# Patient Record
Sex: Male | Born: 1955 | Race: White | Hispanic: No | Marital: Married | Smoking: Never smoker
Health system: Southern US, Community
[De-identification: ages and names within clinical notes are randomized; demographics above are authoritative.]

## PROBLEM LIST (undated history)

## (undated) DIAGNOSIS — Z872 Personal history of diseases of the skin and subcutaneous tissue: Secondary | ICD-10-CM

## (undated) DIAGNOSIS — G809 Cerebral palsy, unspecified: Secondary | ICD-10-CM

## (undated) DIAGNOSIS — I10 Essential (primary) hypertension: Secondary | ICD-10-CM

## (undated) DIAGNOSIS — I2510 Atherosclerotic heart disease of native coronary artery without angina pectoris: Secondary | ICD-10-CM

## (undated) DIAGNOSIS — E785 Hyperlipidemia, unspecified: Secondary | ICD-10-CM

## (undated) DIAGNOSIS — K219 Gastro-esophageal reflux disease without esophagitis: Secondary | ICD-10-CM

## (undated) HISTORY — PX: OTHER SURGICAL HISTORY: SHX169

## (undated) HISTORY — PX: FOOT SURGERY: SHX648

## (undated) HISTORY — PX: HAND SURGERY: SHX662

## (undated) HISTORY — PX: EYE SURGERY: SHX253

## (undated) HISTORY — PX: HIP PINNING: SHX1757

## (undated) HISTORY — PX: CARDIAC SURGERY: SHX584

## (undated) HISTORY — PX: HERNIA REPAIR: SHX51

---

## 2013-04-28 DIAGNOSIS — I10 Essential (primary) hypertension: Secondary | ICD-10-CM | POA: Insufficient documentation

## 2015-08-10 DIAGNOSIS — I10 Essential (primary) hypertension: Secondary | ICD-10-CM | POA: Insufficient documentation

## 2015-08-10 DIAGNOSIS — E785 Hyperlipidemia, unspecified: Secondary | ICD-10-CM | POA: Insufficient documentation

## 2015-08-10 DIAGNOSIS — M719 Bursopathy, unspecified: Secondary | ICD-10-CM | POA: Insufficient documentation

## 2015-08-10 DIAGNOSIS — M21969 Unspecified acquired deformity of unspecified lower leg: Secondary | ICD-10-CM | POA: Insufficient documentation

## 2015-08-10 DIAGNOSIS — M7750 Other enthesopathy of unspecified foot: Secondary | ICD-10-CM | POA: Insufficient documentation

## 2015-08-10 DIAGNOSIS — M7580 Other shoulder lesions, unspecified shoulder: Secondary | ICD-10-CM

## 2015-08-10 DIAGNOSIS — M778 Other enthesopathies, not elsewhere classified: Secondary | ICD-10-CM | POA: Insufficient documentation

## 2015-08-10 DIAGNOSIS — M545 Low back pain, unspecified: Secondary | ICD-10-CM | POA: Insufficient documentation

## 2015-08-19 DIAGNOSIS — K219 Gastro-esophageal reflux disease without esophagitis: Secondary | ICD-10-CM | POA: Insufficient documentation

## 2015-08-30 DIAGNOSIS — G809 Cerebral palsy, unspecified: Secondary | ICD-10-CM | POA: Insufficient documentation

## 2015-12-09 DIAGNOSIS — M21962 Unspecified acquired deformity of left lower leg: Secondary | ICD-10-CM

## 2015-12-09 DIAGNOSIS — M21961 Unspecified acquired deformity of right lower leg: Secondary | ICD-10-CM | POA: Insufficient documentation

## 2015-12-09 DIAGNOSIS — G8929 Other chronic pain: Secondary | ICD-10-CM | POA: Insufficient documentation

## 2015-12-09 DIAGNOSIS — M79672 Pain in left foot: Secondary | ICD-10-CM

## 2015-12-09 DIAGNOSIS — M79671 Pain in right foot: Secondary | ICD-10-CM

## 2016-11-27 ENCOUNTER — Inpatient Hospital Stay (HOSPITAL_COMMUNITY): Payer: Medicare HMO

## 2016-11-27 ENCOUNTER — Inpatient Hospital Stay (HOSPITAL_COMMUNITY)
Admission: EM | Admit: 2016-11-27 | Discharge: 2016-12-03 | DRG: 236 | Disposition: A | Discharge status: Skilled Nursing Facility | Payer: Medicare HMO | Source: Other Acute Inpatient Hospital | Attending: Cardiothoracic Surgery | Admitting: Cardiothoracic Surgery

## 2016-11-27 ENCOUNTER — Encounter (HOSPITAL_COMMUNITY): Payer: Self-pay | Admitting: Physician Assistant

## 2016-11-27 ENCOUNTER — Other Ambulatory Visit: Payer: Self-pay | Admitting: *Deleted

## 2016-11-27 DIAGNOSIS — G8929 Other chronic pain: Secondary | ICD-10-CM | POA: Diagnosis present

## 2016-11-27 DIAGNOSIS — Z951 Presence of aortocoronary bypass graft: Secondary | ICD-10-CM | POA: Diagnosis not present

## 2016-11-27 DIAGNOSIS — Z01810 Encounter for preprocedural cardiovascular examination: Secondary | ICD-10-CM

## 2016-11-27 DIAGNOSIS — E8770 Fluid overload, unspecified: Secondary | ICD-10-CM | POA: Diagnosis not present

## 2016-11-27 DIAGNOSIS — D72829 Elevated white blood cell count, unspecified: Secondary | ICD-10-CM | POA: Diagnosis present

## 2016-11-27 DIAGNOSIS — D62 Acute posthemorrhagic anemia: Secondary | ICD-10-CM | POA: Diagnosis not present

## 2016-11-27 DIAGNOSIS — Z7982 Long term (current) use of aspirin: Secondary | ICD-10-CM

## 2016-11-27 DIAGNOSIS — I214 Non-ST elevation (NSTEMI) myocardial infarction: Secondary | ICD-10-CM | POA: Diagnosis present

## 2016-11-27 DIAGNOSIS — K219 Gastro-esophageal reflux disease without esophagitis: Secondary | ICD-10-CM | POA: Diagnosis present

## 2016-11-27 DIAGNOSIS — I25810 Atherosclerosis of coronary artery bypass graft(s) without angina pectoris: Secondary | ICD-10-CM | POA: Diagnosis not present

## 2016-11-27 DIAGNOSIS — Z79899 Other long term (current) drug therapy: Secondary | ICD-10-CM

## 2016-11-27 DIAGNOSIS — G801 Spastic diplegic cerebral palsy: Secondary | ICD-10-CM | POA: Diagnosis present

## 2016-11-27 DIAGNOSIS — M545 Low back pain: Secondary | ICD-10-CM | POA: Diagnosis present

## 2016-11-27 DIAGNOSIS — I493 Ventricular premature depolarization: Secondary | ICD-10-CM | POA: Diagnosis not present

## 2016-11-27 DIAGNOSIS — I2510 Atherosclerotic heart disease of native coronary artery without angina pectoris: Secondary | ICD-10-CM

## 2016-11-27 DIAGNOSIS — I25110 Atherosclerotic heart disease of native coronary artery with unstable angina pectoris: Secondary | ICD-10-CM

## 2016-11-27 DIAGNOSIS — Z9689 Presence of other specified functional implants: Secondary | ICD-10-CM

## 2016-11-27 DIAGNOSIS — R079 Chest pain, unspecified: Secondary | ICD-10-CM | POA: Diagnosis present

## 2016-11-27 DIAGNOSIS — L0390 Cellulitis, unspecified: Secondary | ICD-10-CM | POA: Insufficient documentation

## 2016-11-27 DIAGNOSIS — E785 Hyperlipidemia, unspecified: Secondary | ICD-10-CM | POA: Diagnosis present

## 2016-11-27 DIAGNOSIS — I10 Essential (primary) hypertension: Secondary | ICD-10-CM | POA: Diagnosis present

## 2016-11-27 DIAGNOSIS — R269 Unspecified abnormalities of gait and mobility: Secondary | ICD-10-CM | POA: Insufficient documentation

## 2016-11-27 HISTORY — DX: Gastro-esophageal reflux disease without esophagitis: K21.9

## 2016-11-27 HISTORY — DX: Essential (primary) hypertension: I10

## 2016-11-27 HISTORY — DX: Cerebral palsy, unspecified: G80.9

## 2016-11-27 HISTORY — DX: Personal history of diseases of the skin and subcutaneous tissue: Z87.2

## 2016-11-27 HISTORY — DX: Hyperlipidemia, unspecified: E78.5

## 2016-11-27 HISTORY — DX: Atherosclerotic heart disease of native coronary artery without angina pectoris: I25.10

## 2016-11-27 LAB — VAS US DOPPLER PRE CABG
LCCADSYS: 108 cm/s
LCCAPDIAS: 25 cm/s
LEFT ECA DIAS: -15 cm/s
LEFT VERTEBRAL DIAS: -17 cm/s
LICADSYS: -49 cm/s
LICAPDIAS: 22 cm/s
Left CCA dist dias: 32 cm/s
Left CCA prox sys: 113 cm/s
Left ICA dist dias: -20 cm/s
Left ICA prox sys: 76 cm/s
RIGHT ECA DIAS: -11 cm/s
RIGHT VERTEBRAL DIAS: -15 cm/s
Right CCA prox dias: 21 cm/s
Right CCA prox sys: 74 cm/s
Right cca dist sys: -71 cm/s

## 2016-11-27 LAB — PULMONARY FUNCTION TEST
FEF 25-75 POST: 2.83 L/s
FEF 25-75 Pre: 2.38 L/sec
FEF2575-%Change-Post: 18 %
FEF2575-%PRED-POST: 113 %
FEF2575-%PRED-PRE: 95 %
FEV1-%CHANGE-POST: 2 %
FEV1-%PRED-POST: 109 %
FEV1-%Pred-Pre: 106 %
FEV1-POST: 3.33 L
FEV1-Pre: 3.24 L
FEV1FVC-%Change-Post: 2 %
FEV1FVC-%PRED-PRE: 96 %
FEV6-%Change-Post: 0 %
FEV6-%PRED-POST: 114 %
FEV6-%Pred-Pre: 114 %
FEV6-Post: 4.38 L
FEV6-Pre: 4.4 L
FEV6FVC-%CHANGE-POST: 0 %
FEV6FVC-%Pred-Post: 103 %
FEV6FVC-%Pred-Pre: 103 %
FVC-%Change-Post: 0 %
FVC-%PRED-POST: 110 %
FVC-%Pred-Pre: 110 %
FVC-Post: 4.45 L
FVC-Pre: 4.46 L
PRE FEV6/FVC RATIO: 99 %
Post FEV1/FVC ratio: 75 %
Post FEV6/FVC ratio: 98 %
Pre FEV1/FVC ratio: 73 %

## 2016-11-27 LAB — COMPREHENSIVE METABOLIC PANEL
ALT: 21 U/L (ref 17–63)
AST: 40 U/L (ref 15–41)
Albumin: 3.6 g/dL (ref 3.5–5.0)
Alkaline Phosphatase: 75 U/L (ref 38–126)
Anion gap: 9 (ref 5–15)
BUN: 15 mg/dL (ref 6–20)
CHLORIDE: 106 mmol/L (ref 101–111)
CO2: 21 mmol/L — ABNORMAL LOW (ref 22–32)
Calcium: 8.9 mg/dL (ref 8.9–10.3)
Creatinine, Ser: 0.87 mg/dL (ref 0.61–1.24)
GFR calc Af Amer: >60 mL/min (ref >60)
Glucose, Bld: 101 mg/dL — ABNORMAL HIGH (ref 65–99)
Potassium: 5 mmol/L (ref 3.5–5.1)
Sodium: 136 mmol/L (ref 135–145)
Total Bilirubin: 1.6 mg/dL — ABNORMAL HIGH (ref 0.3–1.2)
Total Protein: 6.5 g/dL (ref 6.5–8.1)

## 2016-11-27 LAB — TYPE AND SCREEN
ABO/RH(D): O POS
Antibody Screen: NEGATIVE

## 2016-11-27 LAB — CBC
HEMATOCRIT: 48.1 % (ref 39.0–52.0)
Hemoglobin: 16.6 g/dL (ref 13.0–17.0)
MCH: 31.3 pg (ref 26.0–34.0)
MCHC: 34.5 g/dL (ref 30.0–36.0)
MCV: 90.6 fL (ref 78.0–100.0)
Platelets: 270 10*3/uL (ref 150–400)
RBC: 5.31 MIL/uL (ref 4.22–5.81)
RDW: 13.5 % (ref 11.5–15.5)
WBC: 14.9 10*3/uL — ABNORMAL HIGH (ref 4.0–10.5)

## 2016-11-27 LAB — TROPONIN I: TROPONIN I: 0.11 ng/mL — AB (ref <0.03)

## 2016-11-27 LAB — ABO/RH: ABO/RH(D): O POS

## 2016-11-27 MED ORDER — CHLORHEXIDINE GLUCONATE CLOTH 2 % EX PADS
6.0000 | MEDICATED_PAD | Freq: Once | CUTANEOUS | Status: AC
  Administered 2016-11-28: 6 via TOPICAL

## 2016-11-27 MED ORDER — NITROGLYCERIN 0.4 MG SL SUBL: 0.4000 mg | SUBLINGUAL_TABLET | SUBLINGUAL | Status: DC | PRN

## 2016-11-27 MED ORDER — DOPAMINE-DEXTROSE 3.2-5 MG/ML-% IV SOLN
0.0000 ug/kg/min | INTRAVENOUS | Status: DC
  Filled 2016-11-27: qty 250

## 2016-11-27 MED ORDER — ASPIRIN 81 MG PO CHEW: 324.0000 mg | CHEWABLE_TABLET | ORAL | Status: DC

## 2016-11-27 MED ORDER — METOPROLOL TARTRATE 12.5 MG HALF TABLET
12.5000 mg | ORAL_TABLET | Freq: Once | ORAL | Status: DC
  Filled 2016-11-27: qty 1

## 2016-11-27 MED ORDER — ONDANSETRON HCL 4 MG/2ML IJ SOLN: 4.0000 mg | Freq: Four times a day (QID) | INTRAMUSCULAR | Status: DC | PRN

## 2016-11-27 MED ORDER — VANCOMYCIN HCL 10 G IV SOLR
1250.0000 mg | INTRAVENOUS | Status: DC
  Administered 2016-11-28: 1250 mg via INTRAVENOUS
  Filled 2016-11-27: qty 1250

## 2016-11-27 MED ORDER — TEMAZEPAM 15 MG PO CAPS: 15.0000 mg | ORAL_CAPSULE | Freq: Once | ORAL | Status: DC | PRN

## 2016-11-27 MED ORDER — ASPIRIN EC 325 MG PO TBEC: 325.0000 mg | DELAYED_RELEASE_TABLET | Freq: Every day | ORAL | Status: DC

## 2016-11-27 MED ORDER — TRANEXAMIC ACID (OHS) PUMP PRIME SOLUTION
2.0000 mg/kg | INTRAVENOUS | Status: DC
  Filled 2016-11-27: qty 1.52

## 2016-11-27 MED ORDER — PANTOPRAZOLE SODIUM 40 MG PO TBEC
40.0000 mg | DELAYED_RELEASE_TABLET | Freq: Two times a day (BID) | ORAL | Status: DC
  Administered 2016-11-27: 40 mg via ORAL
  Filled 2016-11-27: qty 1

## 2016-11-27 MED ORDER — METOPROLOL TARTRATE 25 MG PO TABS
25.0000 mg | ORAL_TABLET | Freq: Four times a day (QID) | ORAL | Status: DC
  Administered 2016-11-27 (×2): 25 mg via ORAL
  Filled 2016-11-27 (×2): qty 1

## 2016-11-27 MED ORDER — CHLORHEXIDINE GLUCONATE 0.12 % MT SOLN
15.0000 mL | Freq: Once | OROMUCOSAL | Status: AC
  Administered 2016-11-28: 15 mL via OROMUCOSAL
  Filled 2016-11-27: qty 15

## 2016-11-27 MED ORDER — SODIUM CHLORIDE 0.9 % IV SOLN
INTRAVENOUS | Status: DC
  Filled 2016-11-27: qty 30

## 2016-11-27 MED ORDER — BISACODYL 5 MG PO TBEC
5.0000 mg | DELAYED_RELEASE_TABLET | Freq: Once | ORAL | Status: AC
  Administered 2016-11-27: 5 mg via ORAL
  Filled 2016-11-27: qty 1

## 2016-11-27 MED ORDER — TRANEXAMIC ACID (OHS) BOLUS VIA INFUSION
15.0000 mg/kg | INTRAVENOUS | Status: DC
Start: 2016-11-28 — End: 2016-11-28
  Administered 2016-11-28: 1143 mg via INTRAVENOUS
  Filled 2016-11-27: qty 1143

## 2016-11-27 MED ORDER — ATORVASTATIN CALCIUM 80 MG PO TABS
80.0000 mg | ORAL_TABLET | Freq: Every day | ORAL | Status: DC
  Administered 2016-11-27 – 2016-12-02 (×4): 80 mg via ORAL
  Filled 2016-11-27 (×5): qty 1

## 2016-11-27 MED ORDER — ASPIRIN EC 81 MG PO TBEC
81.0000 mg | DELAYED_RELEASE_TABLET | Freq: Every day | ORAL | Status: DC
  Administered 2016-11-28: 81 mg via ORAL
  Filled 2016-11-27: qty 1

## 2016-11-27 MED ORDER — ENOXAPARIN SODIUM 40 MG/0.4ML ~~LOC~~ SOLN: 40.0000 mg | SUBCUTANEOUS | Status: DC

## 2016-11-27 MED ORDER — SODIUM CHLORIDE 0.9 % IV SOLN: INTRAVENOUS | Status: DC

## 2016-11-27 MED ORDER — SODIUM CHLORIDE 0.9 % IV SOLN
INTRAVENOUS | Status: DC
  Administered 2016-11-28: 1 [IU]/h via INTRAVENOUS
  Filled 2016-11-27: qty 1

## 2016-11-27 MED ORDER — ASPIRIN 300 MG RE SUPP: 300.0000 mg | RECTAL | Status: DC

## 2016-11-27 MED ORDER — PAPAVERINE HCL 30 MG/ML IJ SOLN
INTRAMUSCULAR | Status: DC
  Filled 2016-11-27: qty 2.5

## 2016-11-27 MED ORDER — SODIUM CHLORIDE 0.9 % IV SOLN
1.5000 mg/kg/h | INTRAVENOUS | Status: DC
  Filled 2016-11-27: qty 25

## 2016-11-27 MED ORDER — SODIUM CHLORIDE 0.9 % IV SOLN
INTRAVENOUS | Status: DC | PRN
  Administered 2016-11-27: 1000 mL via INTRAVENOUS

## 2016-11-27 MED ORDER — TERBINAFINE HCL 250 MG PO TABS
250.0000 mg | ORAL_TABLET | Freq: Every day | ORAL | Status: DC
Start: 2016-11-28 — End: 2016-12-03
  Administered 2016-11-30 – 2016-12-03 (×4): 250 mg via ORAL
  Filled 2016-11-27 (×6): qty 1

## 2016-11-27 MED ORDER — CEFUROXIME SODIUM 750 MG IJ SOLR
750.0000 mg | INTRAMUSCULAR | Status: DC
  Filled 2016-11-27: qty 750

## 2016-11-27 MED ORDER — ASPIRIN EC 81 MG PO TBEC: 81.0000 mg | DELAYED_RELEASE_TABLET | Freq: Every day | ORAL | Status: DC

## 2016-11-27 MED ORDER — DEXMEDETOMIDINE HCL IN NACL 400 MCG/100ML IV SOLN
0.1000 ug/kg/h | INTRAVENOUS | Status: DC
  Administered 2016-11-28: .3 ug/kg/h via INTRAVENOUS
  Filled 2016-11-27: qty 100

## 2016-11-27 MED ORDER — DEXTROSE 5 % IV SOLN
1.5000 g | INTRAVENOUS | Status: DC
  Administered 2016-11-28: 1.5 g via INTRAVENOUS
  Administered 2016-11-28: .75 g via INTRAVENOUS
  Filled 2016-11-27: qty 1.5

## 2016-11-27 MED ORDER — NITROGLYCERIN IN D5W 200-5 MCG/ML-% IV SOLN
2.0000 ug/min | INTRAVENOUS | Status: DC
  Administered 2016-11-28 (×2): 5 ug/min via INTRAVENOUS
  Filled 2016-11-27: qty 250

## 2016-11-27 MED ORDER — POTASSIUM CHLORIDE 2 MEQ/ML IV SOLN
80.0000 meq | INTRAVENOUS | Status: DC
  Filled 2016-11-27: qty 40

## 2016-11-27 MED ORDER — PHENYLEPHRINE HCL 10 MG/ML IJ SOLN
30.0000 ug/min | INTRAMUSCULAR | Status: DC
Start: 2016-11-28 — End: 2016-11-28
  Filled 2016-11-27: qty 2

## 2016-11-27 MED ORDER — ALBUTEROL SULFATE (2.5 MG/3ML) 0.083% IN NEBU
2.5000 mg | INHALATION_SOLUTION | Freq: Once | RESPIRATORY_TRACT | Status: AC
  Administered 2016-11-27: 2.5 mg via RESPIRATORY_TRACT

## 2016-11-27 MED ORDER — MAGNESIUM SULFATE 50 % IJ SOLN
40.0000 meq | INTRAMUSCULAR | Status: DC
  Filled 2016-11-27: qty 10

## 2016-11-27 MED ORDER — EPINEPHRINE PF 1 MG/ML IJ SOLN
0.0000 ug/min | INTRAVENOUS | Status: DC
  Filled 2016-11-27: qty 4

## 2016-11-27 MED ORDER — HEPARIN (PORCINE) IN NACL 100-0.45 UNIT/ML-% IJ SOLN
1100.0000 [IU]/h | INTRAMUSCULAR | Status: DC
  Administered 2016-11-27: 900 [IU]/h via INTRAVENOUS
  Filled 2016-11-27: qty 250

## 2016-11-27 NOTE — Progress Notes (Signed)
[id number] Gave pt OHS booklet and care guide. Wrote down how to view pre op video. Discussed sternal precautions and demonstrated how to get up without use of arms. Pt stated he uses one arm to help get him get up. Would recommend PT eval after surgery to help with ambulation and to make discharge recommendations as pt lives alone and family out of state. Will need case manager to see also to discuss options. Resp therapist in to perform test and will go over IS. We will follow up after surgery. Luetta NuttingCharlene Leotha Voeltz RN BSN 11/27/2016 3:22 PM

## 2016-11-27 NOTE — Anesthesia Preprocedure Evaluation (Addendum)
Anesthesia Evaluation  Patient identified by MRN, date of birth, ID band Patient awake    Reviewed: Allergy & Precautions, H&P , NPO status , Patient's Chart, lab work & pertinent test results  History of Anesthesia Complications (+) POST - OP SPINAL HEADACHE  Airway Mallampati: II  TM Distance: >3 FB Neck ROM: Full    Dental no notable dental hx. (+) Teeth Intact, Dental Advisory Given   Pulmonary neg pulmonary ROS,    Pulmonary exam normal breath sounds clear to auscultation       Cardiovascular Exercise Tolerance: Good hypertension, Pt. on medications and Pt. on home beta blockers + CAD   Rhythm:Regular Rate:Normal     Neuro/Psych negative neurological ROS  negative psych ROS   GI/Hepatic Neg liver ROS, GERD  Medicated and Controlled,  Endo/Other  negative endocrine ROS  Renal/GU negative Renal ROS  negative genitourinary   Musculoskeletal   Abdominal   Peds  Hematology negative hematology ROS (+)   Anesthesia Other Findings   Reproductive/Obstetrics negative OB ROS                           Anesthesia Physical Anesthesia Plan  ASA: IV  Anesthesia Plan: General   Post-op Pain Management:    Induction: Intravenous  PONV Risk Score and Plan: Treatment may vary due to age or medical condition  Airway Management Planned: Oral ETT  Additional Equipment: Arterial line, CVP, PA Cath, TEE and Ultrasound Guidance Line Placement  Intra-op Plan:   Post-operative Plan: Post-operative intubation/ventilation  Informed Consent: I have reviewed the patients History and Physical, chart, labs and discussed the procedure including the risks, benefits and alternatives for the proposed anesthesia with the patient or authorized representative who has indicated his/her understanding and acceptance.   Dental advisory given  Plan Discussed with: CRNA  Anesthesia Plan Comments:         Anesthesia Quick Evaluation

## 2016-11-27 NOTE — Progress Notes (Signed)
Right Lower Extremity Vein Map    Right Great Saphenous Vein   Segment Diameter Comment  1. Origin 4.578mm   2. High Thigh 3.172mm   3. Mid Thigh 2.665mm branch  4. Low Thigh 2.535mm   5. At Knee 2.11mm   6. High Calf 2.831mm   7. Low Calf 2.520mm   8. Ankle 2.431mm    mm    Right Small Saphenous Vein  Segment Diameter Comment  1. Origin 1.312mm   2. High Calf 1.704mm   3. Low Calf mm Not seen  4. Ankle mm              Left Great Saphenous Vein   Segment Diameter Comment  1. Origin 4.331mm   2. High Thigh 3.415mm   3. Mid Thigh 1.555mm br  4. Low Thigh 2.531mm   5. At Knee 2.931mm   6. High Calf 2.680mm   7. Low Calf 1.78mm   8. Ankle 2.520mm    mm    mm    mm     Left Small Saphenous Vein  Segment Diameter Comment  1. Origin 1.326mm   2. High Calf 1.278mm   3. Low Calf 1.346mm   4. Ankle 1.733mm    mm    mm    mm
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
## 2016-11-27 NOTE — Progress Notes (Addendum)
Assumed care when patient arrived to floor @ 12:38; patient alert & oriented; patient ambulates in room with no signs of distress; patient denies CP ; notified PA about increase Troponin level @1500   Tessa, PA returned phone call about Troponin level; see chart for orders

## 2016-11-27 NOTE — Progress Notes (Signed)
ANTICOAGULATION CONSULT NOTE - Initial Consult  Pharmacy Consult for heparin Indication: chest pain/ACS  Allergies  Allergen Reactions  . No Known Allergies    Patient Measurements: Weight: 168 lb (76.2 kg) Heparin Dosing Weight: 76 Kg  Vital Signs: Temp: 98.7 F (37.1 C) (06/20 1211) Temp Source: Oral (06/20 1211) BP: 162/90 (06/20 1213) Pulse Rate: 63 (06/20 1211)  Labs:  Recent Labs  11/27/16 1253 11/27/16 1529  HGB  --  16.6  HCT  --  48.1  PLT  --  270  CREATININE 0.87  --   TROPONINI 0.11*  --    CrCl cannot be calculated (Unknown ideal weight.).  Medical History: Past Medical History:  Diagnosis Date  . Coronary artery disease   . CP (cerebral palsy) (HCC)   . GERD (gastroesophageal reflux disease)   . History of cellulitis    bilateral lower extemites   . Hyperlipidemia   . Hypertension    Medications:  Prescriptions Prior to Admission  Medication Sig Dispense Refill Last Dose  . aspirin (GOODSENSE ASPIRIN) 325 MG tablet Take 325 mg by mouth daily.   11/27/2016 at Unknown  . atorvastatin (LIPITOR) 80 MG tablet Take 80 mg by mouth daily at 6 PM.    11/26/2016 at Unknown  . losartan (COZAAR) 25 MG tablet Take 25 mg by mouth daily.   11/27/2016 at Unknown time  . metoprolol tartrate (LOPRESSOR) 25 MG tablet Take 25 mg by mouth every 6 (six) hours.   11/27/2016 at 0623  . pantoprazole (PROTONIX) 40 MG tablet Take 40 mg by mouth 2 (two) times daily.   11/27/2016 at Unknown time   Assessment: 61 year old male transferred [facility]G 6/21. No anticoagulation prior to admission. Baseline CBC WNL.   Goal of Therapy:  Heparin level 0.3-0.7 units/ml Monitor platelets by anticoagulation protocol: Yes   Plan:  Give 4000 units bolus x 1 Start heparin infusion at 900 units/hr Check anti-Xa level in 6 hours and daily while on heparin Continue to monitor H&H and platelets  Karyme Mcconathy L Yesica Kemler 11/27/2016,4:45 PM

## 2016-11-27 NOTE — Progress Notes (Signed)
Pre-op Cardiac Surgery  Carotid Findings:  Bilateral 1-39% stenosis. Vertebral arteries - antegrade flow  Upper Extremity Right Left  Brachial Pressures 167 170  Radial Waveforms biphasic triphasic  Ulnar Waveforms Triphasic triphasic  Palmar Arch (Allen's Test) WNL WNL   Findings:      Lower  Extremity Right Left  Dorsalis Pedis 192 - biphasic 0 absent  Posterior Tibial 189 - triphasic 130 - biphasic  Ankle/Brachial Indices 1.1 0.76    Findings:  Right ABI - normal at rest. Left ABI - moderate arterial disease at rest.  Bryce Ramsey, BS, RDMS, RVT

## 2016-11-28 ENCOUNTER — Inpatient Hospital Stay (HOSPITAL_COMMUNITY): Payer: Medicare HMO

## 2016-11-28 ENCOUNTER — Encounter (HOSPITAL_COMMUNITY): Payer: Self-pay | Admitting: Anesthesiology

## 2016-11-28 ENCOUNTER — Inpatient Hospital Stay (HOSPITAL_COMMUNITY): Payer: Medicare HMO | Admitting: Anesthesiology

## 2016-11-28 ENCOUNTER — Encounter (HOSPITAL_COMMUNITY)
Admission: EM | Disposition: A | Discharge status: Skilled Nursing Facility | Payer: Self-pay | Source: Other Acute Inpatient Hospital | Attending: Cardiothoracic Surgery

## 2016-11-28 DIAGNOSIS — I25110 Atherosclerotic heart disease of native coronary artery with unstable angina pectoris: Secondary | ICD-10-CM | POA: Diagnosis not present

## 2016-11-28 HISTORY — PX: CORONARY ARTERY BYPASS GRAFT: SHX141

## 2016-11-28 HISTORY — PX: TEE WITHOUT CARDIOVERSION: SHX5443

## 2016-11-28 LAB — POCT I-STAT 3, ART BLOOD GAS (G3+)
ACID-BASE DEFICIT: 3 mmol/L — AB (ref 0.0–2.0)
Acid-Base Excess: 2 mmol/L (ref 0.0–2.0)
Acid-base deficit: 4 mmol/L — ABNORMAL HIGH (ref 0.0–2.0)
Acid-base deficit: 6 mmol/L — ABNORMAL HIGH (ref 0.0–2.0)
BICARBONATE: 26.5 mmol/L (ref 20.0–28.0)
BICARBONATE: 22.4 mmol/L (ref 20.0–28.0)
Bicarbonate: 21.3 mmol/L (ref 20.0–28.0)
Bicarbonate: 20.4 mmol/L (ref 20.0–28.0)
O2 SAT: 97 %
O2 SAT: 91 %
O2 Saturation: 100 %
O2 Saturation: 94 %
PCO2 ART: 40 mmHg (ref 32.0–48.0)
PCO2 ART: 41.1 mmHg (ref 32.0–48.0)
PCO2 ART: 42.8 mmHg (ref 32.0–48.0)
PH ART: 7.437 (ref 7.350–7.450)
PO2 ART: 301 mmHg — AB (ref 83.0–108.0)
PO2 ART: 91 mmHg (ref 83.0–108.0)
PO2 ART: 73 mmHg — AB (ref 83.0–108.0)
Patient temperature: 36.1
Patient temperature: 37.2
Patient temperature: 38.1
TCO2: 28 mmol/L (ref 0–100)
TCO2: 24 mmol/L (ref 0–100)
TCO2: 22 mmol/L (ref 0–100)
TCO2: 22 mmol/L (ref 0–100)
pCO2 arterial: 39.2 mmHg (ref 32.0–48.0)
pH, Arterial: 7.353 (ref 7.350–7.450)
pH, Arterial: 7.323 — ABNORMAL LOW (ref 7.350–7.450)
pH, Arterial: 7.291 — ABNORMAL LOW (ref 7.350–7.450)
pO2, Arterial: 79 mmHg — ABNORMAL LOW (ref 83.0–108.0)

## 2016-11-28 LAB — POCT I-STAT, CHEM 8
BUN: 12 mg/dL (ref 6–20)
BUN: 11 mg/dL (ref 6–20)
BUN: 10 mg/dL (ref 6–20)
BUN: 10 mg/dL (ref 6–20)
BUN: 9 mg/dL (ref 6–20)
BUN: 8 mg/dL (ref 6–20)
CALCIUM ION: 1.2 mmol/L (ref 1.15–1.40)
CALCIUM ION: 1.16 mmol/L (ref 1.15–1.40)
CHLORIDE: 105 mmol/L (ref 101–111)
CHLORIDE: 105 mmol/L (ref 101–111)
CHLORIDE: 108 mmol/L (ref 101–111)
CREATININE: 0.5 mg/dL — AB (ref 0.61–1.24)
CREATININE: 0.6 mg/dL — AB (ref 0.61–1.24)
CREATININE: 0.7 mg/dL (ref 0.61–1.24)
Calcium, Ion: 1.23 mmol/L (ref 1.15–1.40)
Calcium, Ion: 1 mmol/L — ABNORMAL LOW (ref 1.15–1.40)
Calcium, Ion: 1.05 mmol/L — ABNORMAL LOW (ref 1.15–1.40)
Calcium, Ion: 1.01 mmol/L — ABNORMAL LOW (ref 1.15–1.40)
Chloride: 107 mmol/L (ref 101–111)
Chloride: 100 mmol/L — ABNORMAL LOW (ref 101–111)
Chloride: 104 mmol/L (ref 101–111)
Creatinine, Ser: 0.5 mg/dL — ABNORMAL LOW (ref 0.61–1.24)
Creatinine, Ser: 0.5 mg/dL — ABNORMAL LOW (ref 0.61–1.24)
Creatinine, Ser: 0.5 mg/dL — ABNORMAL LOW (ref 0.61–1.24)
GLUCOSE: 101 mg/dL — AB (ref 65–99)
GLUCOSE: 125 mg/dL — AB (ref 65–99)
Glucose, Bld: 118 mg/dL — ABNORMAL HIGH (ref 65–99)
Glucose, Bld: 100 mg/dL — ABNORMAL HIGH (ref 65–99)
Glucose, Bld: 91 mg/dL (ref 65–99)
Glucose, Bld: 99 mg/dL (ref 65–99)
HCT: 35 % — ABNORMAL LOW (ref 39.0–52.0)
HCT: 26 % — ABNORMAL LOW (ref 39.0–52.0)
HCT: 32 % — ABNORMAL LOW (ref 39.0–52.0)
HEMATOCRIT: 37 % — AB (ref 39.0–52.0)
HEMATOCRIT: 26 % — AB (ref 39.0–52.0)
HEMATOCRIT: 26 % — AB (ref 39.0–52.0)
HEMOGLOBIN: 12.6 g/dL — AB (ref 13.0–17.0)
HEMOGLOBIN: 8.8 g/dL — AB (ref 13.0–17.0)
Hemoglobin: 11.9 g/dL — ABNORMAL LOW (ref 13.0–17.0)
Hemoglobin: 8.8 g/dL — ABNORMAL LOW (ref 13.0–17.0)
Hemoglobin: 8.8 g/dL — ABNORMAL LOW (ref 13.0–17.0)
Hemoglobin: 10.9 g/dL — ABNORMAL LOW (ref 13.0–17.0)
POTASSIUM: 3.8 mmol/L (ref 3.5–5.1)
POTASSIUM: 4.8 mmol/L (ref 3.5–5.1)
POTASSIUM: 3.9 mmol/L (ref 3.5–5.1)
Potassium: 3.7 mmol/L (ref 3.5–5.1)
Potassium: 3.9 mmol/L (ref 3.5–5.1)
Potassium: 4.1 mmol/L (ref 3.5–5.1)
SODIUM: 140 mmol/L (ref 135–145)
SODIUM: 143 mmol/L (ref 135–145)
Sodium: 140 mmol/L (ref 135–145)
Sodium: 141 mmol/L (ref 135–145)
Sodium: 142 mmol/L (ref 135–145)
Sodium: 140 mmol/L (ref 135–145)
TCO2: 27 mmol/L (ref 0–100)
TCO2: 28 mmol/L (ref 0–100)
TCO2: 26 mmol/L (ref 0–100)
TCO2: 27 mmol/L (ref 0–100)
TCO2: 24 mmol/L (ref 0–100)
TCO2: 23 mmol/L (ref 0–100)

## 2016-11-28 LAB — CBC
HCT: 36.1 % — ABNORMAL LOW (ref 39.0–52.0)
HEMATOCRIT: 36.9 % — AB (ref 39.0–52.0)
Hemoglobin: 12.1 g/dL — ABNORMAL LOW (ref 13.0–17.0)
Hemoglobin: 11.8 g/dL — ABNORMAL LOW (ref 13.0–17.0)
MCH: 29.6 pg (ref 26.0–34.0)
MCH: 29.8 pg (ref 26.0–34.0)
MCHC: 32.8 g/dL (ref 30.0–36.0)
MCHC: 32.7 g/dL (ref 30.0–36.0)
MCV: 90.2 fL (ref 78.0–100.0)
MCV: 91.2 fL (ref 78.0–100.0)
Platelets: 171 10*3/uL (ref 150–400)
Platelets: 198 10*3/uL (ref 150–400)
RBC: 4.09 MIL/uL — ABNORMAL LOW (ref 4.22–5.81)
RBC: 3.96 MIL/uL — ABNORMAL LOW (ref 4.22–5.81)
RDW: 13.4 % (ref 11.5–15.5)
RDW: 13.5 % (ref 11.5–15.5)
WBC: 18.6 10*3/uL — AB (ref 4.0–10.5)
WBC: 14.9 10*3/uL — ABNORMAL HIGH (ref 4.0–10.5)

## 2016-11-28 LAB — MAGNESIUM: Magnesium: 2.8 mg/dL — ABNORMAL HIGH (ref 1.7–2.4)

## 2016-11-28 LAB — HEPARIN LEVEL (UNFRACTIONATED): HEPARIN UNFRACTIONATED: 0.13 [IU]/mL — AB (ref 0.30–0.70)

## 2016-11-28 LAB — HEMOGLOBIN A1C
Hgb A1c MFr Bld: 5.7 % — ABNORMAL HIGH (ref 4.8–5.6)
Mean Plasma Glucose: 117 mg/dL

## 2016-11-28 LAB — POCT I-STAT 4, (NA,K, GLUC, HGB,HCT)
Glucose, Bld: 111 mg/dL — ABNORMAL HIGH (ref 65–99)
HCT: 34 % — ABNORMAL LOW (ref 39.0–52.0)
HEMOGLOBIN: 11.6 g/dL — AB (ref 13.0–17.0)
POTASSIUM: 3.6 mmol/L (ref 3.5–5.1)
Sodium: 144 mmol/L (ref 135–145)

## 2016-11-28 LAB — APTT: APTT: 32 s (ref 24–36)

## 2016-11-28 LAB — ECHO TEE
LVOT area: 3.8 cm2
LVOT diameter: 22 mm

## 2016-11-28 LAB — PLATELET COUNT: Platelets: 181 10*3/uL (ref 150–400)

## 2016-11-28 LAB — CREATININE, SERUM
Creatinine, Ser: 0.89 mg/dL (ref 0.61–1.24)
GFR calc Af Amer: >60 mL/min (ref >60)
GFR calc non Af Amer: >60 mL/min (ref >60)

## 2016-11-28 LAB — SURGICAL PCR SCREEN
MRSA, PCR: NEGATIVE
Staphylococcus aureus: POSITIVE — AB

## 2016-11-28 LAB — HIV ANTIBODY (ROUTINE TESTING W REFLEX): HIV SCREEN 4TH GENERATION: NONREACTIVE

## 2016-11-28 LAB — PROTIME-INR
INR: 1.36
Prothrombin Time: 16.9 seconds — ABNORMAL HIGH (ref 11.4–15.2)

## 2016-11-28 LAB — HEMOGLOBIN AND HEMATOCRIT, BLOOD
HCT: 27.6 % — ABNORMAL LOW (ref 39.0–52.0)
Hemoglobin: 9.2 g/dL — ABNORMAL LOW (ref 13.0–17.0)

## 2016-11-28 LAB — GLUCOSE, CAPILLARY: Glucose-Capillary: 105 mg/dL — ABNORMAL HIGH (ref 65–99)

## 2016-11-28 SURGERY — CORONARY ARTERY BYPASS GRAFTING (CABG)
Anesthesia: General | Site: Chest

## 2016-11-28 MED ORDER — ASPIRIN 81 MG PO CHEW
324.0000 mg | CHEWABLE_TABLET | Freq: Every day | ORAL | Status: DC
  Administered 2016-12-01: 324 mg
  Filled 2016-11-28: qty 4

## 2016-11-28 MED ORDER — PROPOFOL 10 MG/ML IV BOLUS
INTRAVENOUS | Status: AC
  Filled 2016-11-28: qty 20

## 2016-11-28 MED ORDER — SODIUM CHLORIDE 0.9 % IV SOLN
INTRAVENOUS | Status: DC | PRN
  Administered 2016-11-28: 12:00:00 via INTRAVENOUS

## 2016-11-28 MED ORDER — PROTAMINE SULFATE 10 MG/ML IV SOLN
INTRAVENOUS | Status: DC | PRN
  Administered 2016-11-28: 22 mg via INTRAVENOUS

## 2016-11-28 MED ORDER — OXYCODONE HCL 5 MG PO TABS
5.0000 mg | ORAL_TABLET | ORAL | Status: DC | PRN
  Administered 2016-11-29 – 2016-12-02 (×6): 10 mg via ORAL
  Filled 2016-11-28 (×6): qty 2

## 2016-11-28 MED ORDER — METOCLOPRAMIDE HCL 5 MG/ML IJ SOLN
10.0000 mg | Freq: Four times a day (QID) | INTRAMUSCULAR | Status: AC
  Administered 2016-11-28 – 2016-11-29 (×3): 10 mg via INTRAVENOUS
  Filled 2016-11-28 (×3): qty 2

## 2016-11-28 MED ORDER — CHLORHEXIDINE GLUCONATE CLOTH 2 % EX PADS
6.0000 | MEDICATED_PAD | Freq: Every day | CUTANEOUS | Status: AC
  Administered 2016-11-28 – 2016-12-02 (×5): 6 via TOPICAL

## 2016-11-28 MED ORDER — PROTAMINE SULFATE 10 MG/ML IV SOLN
INTRAVENOUS | Status: AC
Start: 2016-11-28 — End: ?
  Filled 2016-11-28: qty 25

## 2016-11-28 MED ORDER — SODIUM CHLORIDE 0.9 % IJ SOLN
INTRAMUSCULAR | Status: AC
  Filled 2016-11-28: qty 10

## 2016-11-28 MED ORDER — ROCURONIUM BROMIDE 10 MG/ML (PF) SYRINGE
PREFILLED_SYRINGE | INTRAVENOUS | Status: AC
  Filled 2016-11-28: qty 5

## 2016-11-28 MED ORDER — METOPROLOL TARTRATE 25 MG/10 ML ORAL SUSPENSION: 12.5000 mg | Freq: Two times a day (BID) | ORAL | Status: DC

## 2016-11-28 MED ORDER — FENTANYL CITRATE (PF) 250 MCG/5ML IJ SOLN
INTRAMUSCULAR | Status: AC
  Filled 2016-11-28: qty 25

## 2016-11-28 MED ORDER — BISACODYL 5 MG PO TBEC
10.0000 mg | DELAYED_RELEASE_TABLET | Freq: Every day | ORAL | Status: DC
  Administered 2016-11-30: 10 mg via ORAL
  Filled 2016-11-28 (×2): qty 2

## 2016-11-28 MED ORDER — BISACODYL 10 MG RE SUPP: 10.0000 mg | Freq: Every day | RECTAL | Status: DC

## 2016-11-28 MED ORDER — ETOMIDATE 2 MG/ML IV SOLN
INTRAVENOUS | Status: AC
  Filled 2016-11-28: qty 10

## 2016-11-28 MED ORDER — EPHEDRINE 5 MG/ML INJ
INTRAVENOUS | Status: AC
  Filled 2016-11-28: qty 10

## 2016-11-28 MED ORDER — SODIUM CHLORIDE 0.9% FLUSH
3.0000 mL | Freq: Two times a day (BID) | INTRAVENOUS | Status: DC
  Administered 2016-11-29 – 2016-11-30 (×3): 3 mL via INTRAVENOUS

## 2016-11-28 MED ORDER — CHLORHEXIDINE GLUCONATE 0.12% ORAL RINSE (MEDLINE KIT)
15.0000 mL | Freq: Two times a day (BID) | OROMUCOSAL | Status: DC
  Administered 2016-11-28 – 2016-11-30 (×4): 15 mL via OROMUCOSAL

## 2016-11-28 MED ORDER — SODIUM CHLORIDE 0.9 % IV SOLN: 250.0000 mL | INTRAVENOUS | Status: DC

## 2016-11-28 MED ORDER — CEFUROXIME SODIUM 1.5 G IV SOLR
1.5000 g | Freq: Two times a day (BID) | INTRAVENOUS | Status: AC
  Administered 2016-11-28 – 2016-11-30 (×4): 1.5 g via INTRAVENOUS
  Filled 2016-11-28 (×4): qty 1.5

## 2016-11-28 MED ORDER — HEPARIN SODIUM (PORCINE) 1000 UNIT/ML IJ SOLN
INTRAMUSCULAR | Status: AC
  Filled 2016-11-28: qty 1

## 2016-11-28 MED ORDER — LACTATED RINGERS IV SOLN
INTRAVENOUS | Status: DC | PRN
  Administered 2016-11-28 (×3): via INTRAVENOUS

## 2016-11-28 MED ORDER — ACETAMINOPHEN 650 MG RE SUPP
650.0000 mg | Freq: Once | RECTAL | Status: AC
  Administered 2016-11-28: 650 mg via RECTAL

## 2016-11-28 MED ORDER — LACTATED RINGERS IV SOLN
INTRAVENOUS | Status: DC
  Administered 2016-11-28: 20 mL/h via INTRAVENOUS

## 2016-11-28 MED ORDER — SODIUM CHLORIDE 0.9 % IJ SOLN
OROMUCOSAL | Status: DC | PRN
  Administered 2016-11-28 (×3): 4 mL via TOPICAL

## 2016-11-28 MED ORDER — SODIUM CHLORIDE 0.9 % IV SOLN
0.0000 ug/min | INTRAVENOUS | Status: DC
  Administered 2016-11-28: 40 ug/min via INTRAVENOUS
  Filled 2016-11-28 (×3): qty 2

## 2016-11-28 MED ORDER — MIDAZOLAM HCL 2 MG/2ML IJ SOLN: 2.0000 mg | INTRAMUSCULAR | Status: AC | PRN

## 2016-11-28 MED ORDER — ALBUMIN HUMAN 5 % IV SOLN
250.0000 mL | INTRAVENOUS | Status: AC | PRN
  Administered 2016-11-28 (×2): 250 mL via INTRAVENOUS

## 2016-11-28 MED ORDER — ALBUMIN HUMAN 5 % IV SOLN
INTRAVENOUS | Status: DC | PRN
  Administered 2016-11-28: 13:00:00 via INTRAVENOUS

## 2016-11-28 MED ORDER — MORPHINE SULFATE (PF) 4 MG/ML IV SOLN
1.0000 mg | INTRAVENOUS | Status: AC | PRN
  Administered 2016-11-28: 2 mg via INTRAVENOUS
  Filled 2016-11-28: qty 1

## 2016-11-28 MED ORDER — LACTATED RINGERS IV SOLN
INTRAVENOUS | Status: DC | PRN
  Administered 2016-11-28: 07:00:00 via INTRAVENOUS

## 2016-11-28 MED ORDER — MIDAZOLAM HCL 10 MG/2ML IJ SOLN
INTRAMUSCULAR | Status: AC
  Filled 2016-11-28: qty 2

## 2016-11-28 MED ORDER — FAMOTIDINE IN NACL 20-0.9 MG/50ML-% IV SOLN
20.0000 mg | Freq: Two times a day (BID) | INTRAVENOUS | Status: DC
  Administered 2016-11-28: 20 mg via INTRAVENOUS

## 2016-11-28 MED ORDER — LACTATED RINGERS IV SOLN: INTRAVENOUS | Status: DC

## 2016-11-28 MED ORDER — POTASSIUM CHLORIDE 10 MEQ/50ML IV SOLN
10.0000 meq | INTRAVENOUS | Status: AC
  Administered 2016-11-28 (×3): 10 meq via INTRAVENOUS

## 2016-11-28 MED ORDER — ROCURONIUM BROMIDE 10 MG/ML (PF) SYRINGE
PREFILLED_SYRINGE | INTRAVENOUS | Status: AC
  Filled 2016-11-28: qty 10

## 2016-11-28 MED ORDER — ACETAMINOPHEN 160 MG/5ML PO SOLN: 650.0000 mg | Freq: Once | ORAL | Status: AC

## 2016-11-28 MED ORDER — PANTOPRAZOLE SODIUM 40 MG PO TBEC
40.0000 mg | DELAYED_RELEASE_TABLET | Freq: Every day | ORAL | Status: DC
  Administered 2016-11-30 – 2016-12-03 (×4): 40 mg via ORAL
  Filled 2016-11-28 (×4): qty 1

## 2016-11-28 MED ORDER — ROCURONIUM BROMIDE 10 MG/ML (PF) SYRINGE
PREFILLED_SYRINGE | INTRAVENOUS | Status: DC | PRN
Start: 2016-11-28 — End: 2016-11-28
  Administered 2016-11-28: 70 mg via INTRAVENOUS
  Administered 2016-11-28: 30 mg via INTRAVENOUS
  Administered 2016-11-28 (×2): 40 mg via INTRAVENOUS
  Administered 2016-11-28: 60 mg via INTRAVENOUS

## 2016-11-28 MED ORDER — SODIUM CHLORIDE 0.9 % IV SOLN
INTRAVENOUS | Status: DC
  Filled 2016-11-28: qty 1

## 2016-11-28 MED ORDER — INSULIN REGULAR BOLUS VIA INFUSION
0.0000 [IU] | Freq: Three times a day (TID) | INTRAVENOUS | Status: DC
  Filled 2016-11-28: qty 10

## 2016-11-28 MED ORDER — MUPIROCIN 2 % EX OINT
1.0000 "application " | TOPICAL_OINTMENT | Freq: Two times a day (BID) | CUTANEOUS | Status: AC
  Administered 2016-11-28 – 2016-12-03 (×10): 1 via NASAL
  Filled 2016-11-28 (×2): qty 22

## 2016-11-28 MED ORDER — ACETAMINOPHEN 160 MG/5ML PO SOLN: 1000.0000 mg | Freq: Four times a day (QID) | ORAL | Status: DC

## 2016-11-28 MED ORDER — SODIUM CHLORIDE 0.45 % IV SOLN
INTRAVENOUS | Status: DC | PRN
  Administered 2016-11-28: 20 mL/h via INTRAVENOUS

## 2016-11-28 MED ORDER — CHLORHEXIDINE GLUCONATE 0.12 % MT SOLN
15.0000 mL | OROMUCOSAL | Status: AC
  Administered 2016-11-28: 15 mL via OROMUCOSAL
  Filled 2016-11-28: qty 15

## 2016-11-28 MED ORDER — CHLORHEXIDINE GLUCONATE 0.12 % MT SOLN
OROMUCOSAL | Status: AC
  Administered 2016-11-28: 06:00:00
  Filled 2016-11-28: qty 15

## 2016-11-28 MED ORDER — TRAMADOL HCL 50 MG PO TABS
50.0000 mg | ORAL_TABLET | ORAL | Status: DC | PRN
  Administered 2016-11-29 – 2016-12-02 (×2): 100 mg via ORAL
  Filled 2016-11-28 (×2): qty 1
  Filled 2016-11-28: qty 2

## 2016-11-28 MED ORDER — PHENYLEPHRINE HCL 10 MG/ML IJ SOLN
INTRAMUSCULAR | Status: DC | PRN
  Administered 2016-11-28 (×4): 80 ug via INTRAVENOUS

## 2016-11-28 MED ORDER — VANCOMYCIN HCL IN DEXTROSE 1-5 GM/200ML-% IV SOLN
1000.0000 mg | Freq: Once | INTRAVENOUS | Status: AC
  Administered 2016-11-28: 1000 mg via INTRAVENOUS
  Filled 2016-11-28: qty 200

## 2016-11-28 MED ORDER — SODIUM BICARBONATE 8.4 % IV SOLN
50.0000 meq | Freq: Once | INTRAVENOUS | Status: AC
  Administered 2016-11-28: 50 meq via INTRAVENOUS

## 2016-11-28 MED ORDER — SODIUM CHLORIDE 0.9 % IV SOLN
INTRAVENOUS | Status: DC
  Administered 2016-11-28: 20 mL/h via INTRAVENOUS

## 2016-11-28 MED ORDER — MAGNESIUM SULFATE 4 GM/100ML IV SOLN
4.0000 g | Freq: Once | INTRAVENOUS | Status: AC
  Administered 2016-11-28: 4 g via INTRAVENOUS
  Filled 2016-11-28: qty 100

## 2016-11-28 MED ORDER — PAPAVERINE HCL 30 MG/ML IJ SOLN
INTRAMUSCULAR | Status: DC | PRN
  Administered 2016-11-28: 500 mL via INTRAVASCULAR

## 2016-11-28 MED ORDER — FENTANYL CITRATE (PF) 250 MCG/5ML IJ SOLN
INTRAMUSCULAR | Status: DC | PRN
  Administered 2016-11-28: 100 ug via INTRAVENOUS
  Administered 2016-11-28: 50 ug via INTRAVENOUS
  Administered 2016-11-28: 100 ug via INTRAVENOUS
  Administered 2016-11-28 (×2): 250 ug via INTRAVENOUS
  Administered 2016-11-28: 50 ug via INTRAVENOUS
  Administered 2016-11-28: 150 ug via INTRAVENOUS

## 2016-11-28 MED ORDER — PHENYLEPHRINE HCL 10 MG/ML IJ SOLN
INTRAVENOUS | Status: DC | PRN
  Administered 2016-11-28: 15 ug/min via INTRAVENOUS

## 2016-11-28 MED ORDER — MIDAZOLAM HCL 2 MG/2ML IJ SOLN
INTRAMUSCULAR | Status: AC
  Filled 2016-11-28: qty 2

## 2016-11-28 MED ORDER — ACETAMINOPHEN 500 MG PO TABS
1000.0000 mg | ORAL_TABLET | Freq: Four times a day (QID) | ORAL | Status: DC
  Administered 2016-11-28 – 2016-12-01 (×9): 1000 mg via ORAL
  Filled 2016-11-28 (×10): qty 2

## 2016-11-28 MED ORDER — SODIUM CHLORIDE 0.9 % IV SOLN
0.0000 ug/kg/h | INTRAVENOUS | Status: DC
  Administered 2016-11-28: 0.2 ug/kg/h via INTRAVENOUS
  Filled 2016-11-28 (×2): qty 2

## 2016-11-28 MED ORDER — ASPIRIN EC 325 MG PO TBEC
325.0000 mg | DELAYED_RELEASE_TABLET | Freq: Every day | ORAL | Status: DC
  Administered 2016-11-30 – 2016-12-03 (×3): 325 mg via ORAL
  Filled 2016-11-28 (×3): qty 1

## 2016-11-28 MED ORDER — INSULIN ASPART 100 UNIT/ML ~~LOC~~ SOLN
0.0000 [IU] | SUBCUTANEOUS | Status: DC
  Administered 2016-11-28 – 2016-11-29 (×2): 2 [IU] via SUBCUTANEOUS

## 2016-11-28 MED ORDER — LACTATED RINGERS IV SOLN: 500.0000 mL | Freq: Once | INTRAVENOUS | Status: DC | PRN

## 2016-11-28 MED ORDER — METOPROLOL TARTRATE 5 MG/5ML IV SOLN
2.5000 mg | INTRAVENOUS | Status: DC | PRN
Start: 2016-11-28 — End: 2016-12-03

## 2016-11-28 MED ORDER — SODIUM CHLORIDE 0.9% FLUSH: 3.0000 mL | INTRAVENOUS | Status: DC | PRN

## 2016-11-28 MED ORDER — METOPROLOL TARTRATE 12.5 MG HALF TABLET
12.5000 mg | ORAL_TABLET | Freq: Two times a day (BID) | ORAL | Status: DC
  Administered 2016-11-30 – 2016-12-02 (×6): 12.5 mg via ORAL
  Filled 2016-11-28 (×6): qty 1

## 2016-11-28 MED ORDER — ORAL CARE MOUTH RINSE
15.0000 mL | Freq: Four times a day (QID) | OROMUCOSAL | Status: DC
  Administered 2016-11-28 – 2016-11-29 (×3): 15 mL via OROMUCOSAL

## 2016-11-28 MED ORDER — NITROGLYCERIN IN D5W 200-5 MCG/ML-% IV SOLN: 0.0000 ug/min | INTRAVENOUS | Status: DC

## 2016-11-28 MED ORDER — ETOMIDATE 2 MG/ML IV SOLN
INTRAVENOUS | Status: DC | PRN
  Administered 2016-11-28: 12 mg via INTRAVENOUS

## 2016-11-28 MED ORDER — 0.9 % SODIUM CHLORIDE (POUR BTL) OPTIME
TOPICAL | Status: DC | PRN
  Administered 2016-11-28: 6000 mL

## 2016-11-28 MED ORDER — TRANEXAMIC ACID 1000 MG/10ML IV SOLN
INTRAVENOUS | Status: DC | PRN
  Administered 2016-11-28: 1.5 mg/kg/h via INTRAVENOUS

## 2016-11-28 MED ORDER — ONDANSETRON HCL 4 MG/2ML IJ SOLN
4.0000 mg | Freq: Four times a day (QID) | INTRAMUSCULAR | Status: DC | PRN
  Administered 2016-11-29: 4 mg via INTRAVENOUS
  Filled 2016-11-28: qty 2

## 2016-11-28 MED ORDER — HEMOSTATIC AGENTS (NO CHARGE) OPTIME
TOPICAL | Status: DC | PRN
  Administered 2016-11-28 (×2): 1 via TOPICAL

## 2016-11-28 MED ORDER — HEPARIN SODIUM (PORCINE) 1000 UNIT/ML IJ SOLN
INTRAMUSCULAR | Status: DC | PRN
  Administered 2016-11-28: 25000 [IU] via INTRAVENOUS

## 2016-11-28 MED ORDER — MORPHINE SULFATE (PF) 4 MG/ML IV SOLN
2.0000 mg | INTRAVENOUS | Status: AC | PRN
  Administered 2016-11-28 – 2016-11-30 (×3): 4 mg via INTRAVENOUS
  Filled 2016-11-28 (×3): qty 1

## 2016-11-28 MED ORDER — MIDAZOLAM HCL 5 MG/5ML IJ SOLN
INTRAMUSCULAR | Status: DC | PRN
  Administered 2016-11-28: 2 mg via INTRAVENOUS
  Administered 2016-11-28: 1 mg via INTRAVENOUS
  Administered 2016-11-28: 3 mg via INTRAVENOUS
  Administered 2016-11-28: 2 mg via INTRAVENOUS
  Administered 2016-11-28: 3 mg via INTRAVENOUS
  Administered 2016-11-28: 1 mg via INTRAVENOUS

## 2016-11-28 MED ORDER — DOCUSATE SODIUM 100 MG PO CAPS
200.0000 mg | ORAL_CAPSULE | Freq: Every day | ORAL | Status: DC
  Administered 2016-11-30 – 2016-12-02 (×3): 200 mg via ORAL
  Filled 2016-11-28 (×4): qty 2

## 2016-11-28 MED FILL — Potassium Chloride Inj 2 mEq/ML: INTRAVENOUS | Qty: 10 | Status: AC

## 2016-11-28 MED FILL — Heparin Sodium (Porcine) Inj 1000 Unit/ML: INTRAMUSCULAR | Qty: 30 | Status: AC

## 2016-11-28 MED FILL — Magnesium Sulfate Inj 50%: INTRAMUSCULAR | Qty: 10 | Status: AC

## 2016-11-28 SURGICAL SUPPLY — 71 items
APPLICATOR COTTON TIP 6IN STRL (MISCELLANEOUS) ×3 IMPLANT
BAG DECANTER FOR FLEXI CONT (MISCELLANEOUS) ×3 IMPLANT
BANDAGE ACE 4X5 VEL STRL LF (GAUZE/BANDAGES/DRESSINGS) ×3 IMPLANT
BANDAGE ACE 6X5 VEL STRL LF (GAUZE/BANDAGES/DRESSINGS) ×3 IMPLANT
BANDAGE ELASTIC 4 VELCRO ST LF (GAUZE/BANDAGES/DRESSINGS) ×3 IMPLANT
BANDAGE ELASTIC 6 VELCRO ST LF (GAUZE/BANDAGES/DRESSINGS) ×3 IMPLANT
BLADE STERNUM SYSTEM 6 (BLADE) ×3 IMPLANT
BNDG GAUZE ELAST 4 BULKY (GAUZE/BANDAGES/DRESSINGS) ×6 IMPLANT
CANISTER SUCT 3000ML PPV (MISCELLANEOUS) ×3 IMPLANT
CATH CPB KIT GERHARDT (MISCELLANEOUS) ×3 IMPLANT
CATH THORACIC 28FR (CATHETERS) ×3 IMPLANT
COVER MAYO STAND STRL (DRAPES) ×3 IMPLANT
CRADLE DONUT ADULT HEAD (MISCELLANEOUS) ×3 IMPLANT
DERMABOND ADVANCED (GAUZE/BANDAGES/DRESSINGS) ×1
DERMABOND ADVANCED .7 DNX12 (GAUZE/BANDAGES/DRESSINGS) ×2 IMPLANT
DRAIN CHANNEL 28F RND 3/8 FF (WOUND CARE) ×3 IMPLANT
DRAPE CARDIOVASCULAR INCISE (DRAPES) ×1
DRAPE SLUSH/WARMER DISC (DRAPES) ×3 IMPLANT
DRAPE SRG 135X102X78XABS (DRAPES) ×2 IMPLANT
DRSG AQUACEL AG ADV 3.5X14 (GAUZE/BANDAGES/DRESSINGS) ×3 IMPLANT
ELECT BLADE 4.0 EZ CLEAN MEGAD (MISCELLANEOUS) ×3
ELECT REM PT RETURN 9FT ADLT (ELECTROSURGICAL) ×6
ELECTRODE BLDE 4.0 EZ CLN MEGD (MISCELLANEOUS) ×2 IMPLANT
ELECTRODE REM PT RTRN 9FT ADLT (ELECTROSURGICAL) ×4 IMPLANT
FELT TEFLON 1X6 (MISCELLANEOUS) ×6 IMPLANT
GAUZE SPONGE 4X4 12PLY STRL (GAUZE/BANDAGES/DRESSINGS) ×6 IMPLANT
GAUZE SPONGE 4X4 12PLY STRL LF (GAUZE/BANDAGES/DRESSINGS) ×9 IMPLANT
GLOVE BIO SURGEON STRL SZ 6.5 (GLOVE) ×36 IMPLANT
GOWN STRL REUS W/ TWL LRG LVL3 (GOWN DISPOSABLE) ×20 IMPLANT
GOWN STRL REUS W/TWL LRG LVL3 (GOWN DISPOSABLE) ×10
HEMOSTAT POWDER SURGIFOAM 1G (HEMOSTASIS) ×9 IMPLANT
HEMOSTAT SURGICEL 2X14 (HEMOSTASIS) ×3 IMPLANT
KIT BASIN OR (CUSTOM PROCEDURE TRAY) ×3 IMPLANT
KIT CATH SUCT 8FR (CATHETERS) ×3 IMPLANT
KIT ROOM TURNOVER OR (KITS) ×3 IMPLANT
KIT SUCTION CATH 14FR (SUCTIONS) ×6 IMPLANT
KIT VASOVIEW HEMOPRO VH 3000 (KITS) ×3 IMPLANT
LEAD PACING MYOCARDI (MISCELLANEOUS) ×3 IMPLANT
MARKER GRAFT CORONARY BYPASS (MISCELLANEOUS) ×9 IMPLANT
NS IRRIG 1000ML POUR BTL (IV SOLUTION) ×18 IMPLANT
PACK OPEN HEART (CUSTOM PROCEDURE TRAY) ×3 IMPLANT
PAD ARMBOARD 7.5X6 YLW CONV (MISCELLANEOUS) ×6 IMPLANT
PAD ELECT DEFIB RADIOL ZOLL (MISCELLANEOUS) ×3 IMPLANT
PENCIL BUTTON HOLSTER BLD 10FT (ELECTRODE) ×3 IMPLANT
PUNCH AORTIC ROT 4.0MM RCL 40 (MISCELLANEOUS) ×3 IMPLANT
SET CARDIOPLEGIA MPS 5001102 (MISCELLANEOUS) ×3 IMPLANT
SPONGE LAP 18X18 X RAY DECT (DISPOSABLE) ×12 IMPLANT
SUT BONE WAX W31G (SUTURE) ×3 IMPLANT
SUT PROLENE 3 0 SH 1 (SUTURE) ×3 IMPLANT
SUT PROLENE 3 0 SH1 36 (SUTURE) ×3 IMPLANT
SUT PROLENE 4 0 TF (SUTURE) ×6 IMPLANT
SUT PROLENE 6 0 C 1 30 (SUTURE) ×6 IMPLANT
SUT PROLENE 6 0 CC (SUTURE) ×18 IMPLANT
SUT PROLENE 7 0 BV1 MDA (SUTURE) ×6 IMPLANT
SUT PROLENE 7.0 RB 3 (SUTURE) ×12 IMPLANT
SUT PROLENE 8 0 BV175 6 (SUTURE) ×6 IMPLANT
SUT STEEL 6MS V (SUTURE) ×3 IMPLANT
SUT STEEL SZ 6 DBL 3X14 BALL (SUTURE) ×3 IMPLANT
SUT VIC AB 1 CTX 18 (SUTURE) ×6 IMPLANT
SUTURE E-PAK OPEN HEART (SUTURE) ×3 IMPLANT
SYSTEM SAHARA CHEST DRAIN ATS (WOUND CARE) ×3 IMPLANT
TAPE CLOTH SURG 4X10 WHT LF (GAUZE/BANDAGES/DRESSINGS) ×6 IMPLANT
TAPE PAPER 3X10 WHT MICROPORE (GAUZE/BANDAGES/DRESSINGS) ×3 IMPLANT
TOWEL GREEN STERILE (TOWEL DISPOSABLE) ×3 IMPLANT
TOWEL GREEN STERILE FF (TOWEL DISPOSABLE) ×3 IMPLANT
TOWEL OR 17X24 6PK STRL BLUE (TOWEL DISPOSABLE) ×3 IMPLANT
TOWEL OR 17X26 10 PK STRL BLUE (TOWEL DISPOSABLE) ×3 IMPLANT
TRAY FOLEY SILVER 16FR TEMP (SET/KITS/TRAYS/PACK) ×3 IMPLANT
TUBING INSUFFLATION (TUBING) ×3 IMPLANT
UNDERPAD 30X30 (UNDERPADS AND DIAPERS) ×3 IMPLANT
WATER STERILE IRR 1000ML POUR (IV SOLUTION) ×6 IMPLANT

## 2016-11-28 NOTE — Transfer of Care (Signed)
Immediate Anesthesia Transfer of Care Note  Patient: Bryce Ramsey  Procedure(s) Performed: Procedure(s): CORONARY ARTERY BYPASS GRAFTING (CABG) x four, using left internal mammary artery and bilateral upper thighs saphenous vein harvested endoscopically - LIMA to LAD, -SVG to DIAGONAL, SVG to CIRCUMFLEX, -SVG to RCA  (N/A) TRANSESOPHAGEAL ECHOCARDIOGRAM (TEE) (N/A)  Patient Location: SICU  Anesthesia Type:General  Level of Consciousness: sedated, unresponsive and Patient remains intubated per anesthesia plan  Airway & Oxygen Therapy: Patient remains intubated per anesthesia plan and Patient placed on Ventilator (see vital sign flow sheet for setting)  Post-op Assessment: Report given to RN and Post -op Vital signs reviewed and stable  Post vital signs: Reviewed and stable  Last Vitals:  Vitals:   11/28/16 0200 11/28/16 0417  BP:  140/84  Pulse: (!) 58 (!) 56  Resp:  17  Temp:  36.8 C    Last Pain:  Vitals:   11/28/16 0417  TempSrc: Oral         Complications: No apparent anesthesia complications

## 2016-11-28 NOTE — OR Nursing (Signed)
12:35pm - 45 minute call to SICU charge nurse 

## 2016-11-28 NOTE — Progress Notes (Signed)
      [address]       [name] [id number]             [phone]     Pre Procedure note for inpatients:   Bryce Ramsey has been scheduled for Procedure(s): CORONARY ARTERY BYPASS GRAFTING (CABG) (N/A) TRANSESOPHAGEAL ECHOCARDIOGRAM (TEE) (N/A) today. The various methods of treatment have been discussed with the patient. After consideration of the risks, benefits and treatment options the patient has consented to the planned procedure. With critical left main and rca proximal disease patient aware of critical nature of coronary anatomy   The patient has been seen and labs reviewed. There are no changes in the patient's condition to prevent proceeding with the planned procedure today.  Recent labs:  Lab Results  Component Value Date   WBC 14.9 (H) 11/27/2016   HGB 16.6 11/27/2016   HCT 48.1 11/27/2016   PLT 270 11/27/2016   GLUCOSE 101 (H) 11/27/2016   ALT 21 11/27/2016   AST 40 11/27/2016   NA 136 11/27/2016   K 5.0 11/27/2016   CL 106 11/27/2016   CREATININE 0.87 11/27/2016   BUN 15 11/27/2016   CO2 21 (L) 11/27/2016   HGBA1C 5.7 (H) 11/27/2016    Bryce Ramsey Bryce Meka Lewan, MD 11/28/2016 7:04 AM

## 2016-11-28 NOTE — Care Management Note (Signed)
Case Management Note Donn PieriniKristi Suprina Mandeville RN, BSN Unit 2W-Case Manager-- 2H coverage [phone]  Patient Details  Name: Bryce Ramsey MRN: [id number] Date of Birth: February 12, 1956  Subjective/Objective:  Pt admitted s/p CABG 11/28/16                  Action/Plan: PTA Pt lived at home alone- referral received regarding possible SNF need- per note pt's wife is in a SNF- and pt does not have any other family - will consult CSW to assist in social assessment- and follow pt progression post op  Expected Discharge Date:                  Expected Discharge Plan:  Skilled Nursing Facility  In-House Referral:     Discharge planning Services  CM Consult  Post Acute Care Choice:    Choice offered to:     DME Arranged:    DME Agency:     HH Arranged:    HH Agency:     Status of Service:  In process, will continue to follow  If discussed at Long Length of Stay Meetings, dates discussed:    Additional Comments:  Darrold SpanWebster, Onofrio Klemp Hall, RN 11/28/2016, 3:40 PM

## 2016-11-28 NOTE — Progress Notes (Signed)
Prior to extubation patient had a VC of 1.3L and a NIF of -40.

## 2016-11-28 NOTE — Progress Notes (Signed)
ANTICOAGULATION CONSULT NOTE - Follow Up Consult  Pharmacy Consult for heparin Indication: CAD awaiting CABG  Labs:  Recent Labs  11/27/16 1253 11/27/16 1529 11/27/16 2333  HGB  --  16.6  --   HCT  --  48.1  --   PLT  --  270  --   HEPARINUNFRC  --   --  0.13*  CREATININE 0.87  --   --   TROPONINI 0.11*  --   --     Assessment: 61yo male subtherapeutic on heparin with initial dosing for CAD while awaiting CABG.  Goal of Therapy:  Heparin level 0.3-0.7 units/ml   Plan:  Will increase heparin gtt by 3 units/kg/hr to 1100 units/hr until off for OR.  Vernard GamblesVeronda Jaidyn Usery, PharmD, BCPS  11/28/2016,12:24 AM

## 2016-11-28 NOTE — Anesthesia Postprocedure Evaluation (Signed)
Anesthesia Post Note  Patient: Corine ShelterDale P Creps  Procedure(s) Performed: Procedure(s) (LRB): CORONARY ARTERY BYPASS GRAFTING (CABG) x four, using left internal mammary artery and bilateral upper thighs saphenous vein harvested endoscopically - LIMA to LAD, -SVG to DIAGONAL, SVG to CIRCUMFLEX, -SVG to RCA  (N/A) TRANSESOPHAGEAL ECHOCARDIOGRAM (TEE) (N/A)     Anesthesia Post Evaluation  Last Vitals:  Vitals:   11/28/16 1500 11/28/16 1515  BP: 114/79 109/75  Pulse: 80 80  Resp: 12 16  Temp: 36.1 C 36.1 C    Last Pain:  Vitals:   11/28/16 0417  TempSrc: Oral                 Roxine Whittinghill,W. EDMOND

## 2016-11-28 NOTE — OR Nursing (Signed)
13:05 - 20 minute call to SICU charge nurse

## 2016-11-28 NOTE — Anesthesia Procedure Notes (Addendum)
Central Venous Catheter Insertion Performed by: Roderic Palau, anesthesiologist Start/End6/21/2018 6:50 AM, 11/28/2016 7:00 AM Patient location: Pre-op. Preanesthetic checklist: patient identified, IV checked, site marked, risks and benefits discussed, surgical consent, monitors and equipment checked, pre-op evaluation, timeout performed and anesthesia consent Position: Trendelenburg Lidocaine 1% used for infiltration and patient sedated Hand hygiene performed , maximum sterile barriers used  and Seldinger technique used Catheter size: 9 Fr Total catheter length 10. Central line and PA cath was placed.MAC introducer Swan type:thermodilution PA Cath depth:50 Procedure performed using ultrasound guided technique. Ultrasound Notes:anatomy identified, needle tip was noted to be adjacent to the nerve/plexus identified, no ultrasound evidence of intravascular and/or intraneural injection and image(s) printed for medical record Attempts: 1 Following insertion, line sutured and dressing applied. Post procedure assessment: blood return through all ports, free fluid flow and no air  Patient tolerated the procedure well with no immediate complications.

## 2016-11-28 NOTE — Brief Op Note (Addendum)
      [address]       [name] [id number]             [phone]       11/28/2016  1:56 PM  PATIENT:  Corine Shelterale P Yerby  61 y.o. male  PRE-OPERATIVE DIAGNOSIS:  CAD, Bradycardia -No BB administered prior to surgery due to low HR  POST-OPERATIVE DIAGNOSIS:  CAD with critical left main and proximal rca disease   PROCEDURE:  Procedure(s):  CORONARY ARTERY BYPASS GRAFTING x 4 -LIMA to LAD -SVG to DIAGONAL -SVG to CIRCUMFLEX -SVG to RCA  ENDOSCOPIC HARVEST GREATER SAPHENOUS VEIN -Right Leg -Left Thigh  TRANSESOPHAGEAL ECHOCARDIOGRAM (TEE) (N/A)  SURGEON:  Surgeon(s) and Role:    * Delight OvensGerhardt, Lilliauna Van B, MD - Primary  PHYSICIAN ASSISTANT: Lowella DandyErin Barrett PA-C  ANESTHESIA:   general  EBL:  Total I/O In: 3975 [I.V.:3200; Blood:525; IV Piggyback:250] Out: 3645 [Urine:2225; Blood:1420]  BLOOD ADMINISTERED:CELLSAVER  DRAINS: Left pleural chest tube, mediastinal drain   LOCAL MEDICATIONS USED:  NONE  SPECIMEN:  No Specimen  DISPOSITION OF SPECIMEN:  N/A  COUNTS:  YES  TOURNIQUET:  * No tourniquets in log *  DICTATION: .Dragon Dictation  PLAN OF CARE: Admit to inpatient   PATIENT DISPOSITION:  ICU - extubated and stable.   Delay start of Pharmacological VTE agent (>24hrs) due to surgical blood loss or risk of bleeding: yes

## 2016-11-28 NOTE — Progress Notes (Signed)
      [address]       [name] [id number]             [phone]      S/p CABG  Extubated  BP 99/69   Pulse 80   Temp (!) 100.4 F (38 C)   Resp 19   Ht 5\' 6"  (1.676 m)   Wt 165 lb 6.4 oz (75 kg)   SpO2 99%   BMI 26.70 kg/m  CO- 4.2, CI= 2.3  Intake/Output Summary (Last 24 hours) at 11/28/16 1913 Last data filed at 11/28/16 1900  Gross per 24 hour  Intake          5044.79 ml  Output             5635 ml  Net          -590.21 ml   Doing well early postop  Viviann SpareSteven C. Dorris FetchHendrickson, MD [facility] [phone]

## 2016-11-28 NOTE — Anesthesia Postprocedure Evaluation (Addendum)
Anesthesia Post Note  Patient: Bryce Ramsey  Procedure(s) Performed: Procedure(s) (LRB): CORONARY ARTERY BYPASS GRAFTING (CABG) x four, using left internal mammary artery and bilateral upper thighs saphenous vein harvested endoscopically - LIMA to LAD, -SVG to DIAGONAL, SVG to CIRCUMFLEX, -SVG to RCA  (N/A) TRANSESOPHAGEAL ECHOCARDIOGRAM (TEE) (N/A)     Patient location during evaluation: SICU Anesthesia Type: General Level of consciousness: sedated Pain management: pain level controlled Vital Signs Assessment: post-procedure vital signs reviewed and stable Respiratory status: patient remains intubated per anesthesia plan Cardiovascular status: stable Anesthetic complications: no    Last Vitals:  Vitals:   11/28/16 1500 11/28/16 1515  BP: 114/79 109/75  Pulse: 80 80  Resp: 12 16  Temp: 36.1 C 36.1 C    Last Pain:  Vitals:   11/28/16 0417  TempSrc: Oral                 Dino Borntreger,W. EDMOND

## 2016-11-28 NOTE — Anesthesia Procedure Notes (Signed)
Procedure Name: Intubation Date/Time: 11/28/2016 7:47 AM Performed by: Kyung Rudd Pre-anesthesia Checklist: Patient identified, Emergency Drugs available, Suction available, Patient being monitored and Timeout performed Patient Re-evaluated:Patient Re-evaluated prior to inductionOxygen Delivery Method: Circle system utilized Preoxygenation: Pre-oxygenation with 100% oxygen Intubation Type: IV induction Ventilation: Mask ventilation without difficulty Laryngoscope Size: Mac and 3 Grade View: Grade I Tube type: Oral Number of attempts: 1 Airway Equipment and Method: Stylet Placement Confirmation: ETT inserted through vocal cords under direct vision,  positive ETCO2,  CO2 detector and breath sounds checked- equal and bilateral Secured at: 23 cm Tube secured with: Tape Dental Injury: Teeth and Oropharynx as per pre-operative assessment

## 2016-11-28 NOTE — Procedures (Signed)
Extubation Procedure Note  Patient Details:   Name: Bryce Ramsey DOB: [birth year] MRN: [id number]   Airway Documentation:     Evaluation  O2 sats: stable throughout Complications: No apparent complications Patient did tolerate procedure well. Bilateral Breath Sounds: Clear   Yes   Patient was extubated to 4L. Cuff leak was heard prior to extubation. No stridor noted. RN at bedside with RT during extubation.  Darolyn Ruashley M Sofie Schendel 11/28/2016, 5:57 PM

## 2016-11-28 NOTE — Progress Notes (Signed)
  Echocardiogram Echocardiogram Transesophageal has been performed.  Janalyn HarderWest, Darcey Demma R 11/28/2016, 11:47 AM

## 2016-11-29 ENCOUNTER — Encounter (HOSPITAL_COMMUNITY): Payer: Self-pay | Admitting: Cardiothoracic Surgery

## 2016-11-29 ENCOUNTER — Inpatient Hospital Stay (HOSPITAL_COMMUNITY): Payer: Medicare HMO

## 2016-11-29 LAB — GLUCOSE, CAPILLARY
GLUCOSE-CAPILLARY: 113 mg/dL — AB (ref 65–99)
GLUCOSE-CAPILLARY: 136 mg/dL — AB (ref 65–99)
GLUCOSE-CAPILLARY: 146 mg/dL — AB (ref 65–99)
GLUCOSE-CAPILLARY: 177 mg/dL — AB (ref 65–99)
Glucose-Capillary: 112 mg/dL — ABNORMAL HIGH (ref 65–99)
Glucose-Capillary: 142 mg/dL — ABNORMAL HIGH (ref 65–99)

## 2016-11-29 LAB — BASIC METABOLIC PANEL
ANION GAP: 6 (ref 5–15)
BUN: 8 mg/dL (ref 6–20)
CO2: 25 mmol/L (ref 22–32)
Calcium: 7.5 mg/dL — ABNORMAL LOW (ref 8.9–10.3)
Chloride: 109 mmol/L (ref 101–111)
Creatinine, Ser: 0.87 mg/dL (ref 0.61–1.24)
GFR calc Af Amer: >60 mL/min (ref >60)
GLUCOSE: 132 mg/dL — AB (ref 65–99)
POTASSIUM: 4 mmol/L (ref 3.5–5.1)
Sodium: 140 mmol/L (ref 135–145)

## 2016-11-29 LAB — CBC
HEMATOCRIT: 35 % — AB (ref 39.0–52.0)
HEMATOCRIT: 35.7 % — AB (ref 39.0–52.0)
Hemoglobin: 11.3 g/dL — ABNORMAL LOW (ref 13.0–17.0)
Hemoglobin: 11.6 g/dL — ABNORMAL LOW (ref 13.0–17.0)
MCH: 29.7 pg (ref 26.0–34.0)
MCH: 30.1 pg (ref 26.0–34.0)
MCHC: 32.3 g/dL (ref 30.0–36.0)
MCHC: 32.5 g/dL (ref 30.0–36.0)
MCV: 92.1 fL (ref 78.0–100.0)
MCV: 92.7 fL (ref 78.0–100.0)
PLATELETS: 212 10*3/uL (ref 150–400)
Platelets: 220 10*3/uL (ref 150–400)
RBC: 3.8 MIL/uL — AB (ref 4.22–5.81)
RBC: 3.85 MIL/uL — AB (ref 4.22–5.81)
RDW: 13.7 % (ref 11.5–15.5)
RDW: 13.8 % (ref 11.5–15.5)
WBC: 21 10*3/uL — AB (ref 4.0–10.5)
WBC: 18.7 10*3/uL — AB (ref 4.0–10.5)

## 2016-11-29 LAB — MAGNESIUM
MAGNESIUM: 2.3 mg/dL (ref 1.7–2.4)
Magnesium: 2.3 mg/dL (ref 1.7–2.4)

## 2016-11-29 LAB — POCT I-STAT, CHEM 8
BUN: 8 mg/dL (ref 6–20)
CALCIUM ION: 1.17 mmol/L (ref 1.15–1.40)
CREATININE: 0.7 mg/dL (ref 0.61–1.24)
Chloride: 101 mmol/L (ref 101–111)
Glucose, Bld: 177 mg/dL — ABNORMAL HIGH (ref 65–99)
HEMATOCRIT: 35 % — AB (ref 39.0–52.0)
HEMOGLOBIN: 11.9 g/dL — AB (ref 13.0–17.0)
Potassium: 3.9 mmol/L (ref 3.5–5.1)
SODIUM: 139 mmol/L (ref 135–145)
TCO2: 25 mmol/L (ref 0–100)

## 2016-11-29 LAB — CREATININE, SERUM: Creatinine, Ser: 0.84 mg/dL (ref 0.61–1.24)

## 2016-11-29 MED ORDER — INSULIN ASPART 100 UNIT/ML ~~LOC~~ SOLN
0.0000 [IU] | Freq: Four times a day (QID) | SUBCUTANEOUS | Status: DC
  Administered 2016-11-29: 2 [IU] via SUBCUTANEOUS
  Administered 2016-11-29: 4 [IU] via SUBCUTANEOUS

## 2016-11-29 MED ORDER — FUROSEMIDE 10 MG/ML IJ SOLN
20.0000 mg | Freq: Once | INTRAMUSCULAR | Status: AC
  Administered 2016-11-29: 20 mg via INTRAVENOUS
  Filled 2016-11-29: qty 2

## 2016-11-29 MED ORDER — INSULIN DETEMIR 100 UNIT/ML ~~LOC~~ SOLN
20.0000 [IU] | Freq: Every day | SUBCUTANEOUS | Status: DC
  Filled 2016-11-29: qty 0.2

## 2016-11-29 MED ORDER — ENOXAPARIN SODIUM 30 MG/0.3ML ~~LOC~~ SOLN
30.0000 mg | Freq: Every day | SUBCUTANEOUS | Status: DC
  Administered 2016-11-29 – 2016-12-02 (×4): 30 mg via SUBCUTANEOUS
  Filled 2016-11-29 (×4): qty 0.3

## 2016-11-29 MED ORDER — INSULIN DETEMIR 100 UNIT/ML ~~LOC~~ SOLN
20.0000 [IU] | Freq: Once | SUBCUTANEOUS | Status: AC
  Administered 2016-11-29: 20 [IU] via SUBCUTANEOUS
  Filled 2016-11-29: qty 0.2

## 2016-11-29 MED ORDER — ORAL CARE MOUTH RINSE
15.0000 mL | Freq: Two times a day (BID) | OROMUCOSAL | Status: DC
  Administered 2016-12-01 – 2016-12-02 (×3): 15 mL via OROMUCOSAL

## 2016-11-29 MED FILL — Sodium Bicarbonate IV Soln 8.4%: INTRAVENOUS | Qty: 50 | Status: AC

## 2016-11-29 MED FILL — Mannitol IV Soln 20%: INTRAVENOUS | Qty: 500 | Status: AC

## 2016-11-29 MED FILL — Lidocaine HCl IV Inj 20 MG/ML: INTRAVENOUS | Qty: 5 | Status: AC

## 2016-11-29 MED FILL — Sodium Chloride IV Soln 0.9%: INTRAVENOUS | Qty: 2000 | Status: AC

## 2016-11-29 MED FILL — Heparin Sodium (Porcine) Inj 1000 Unit/ML: INTRAMUSCULAR | Qty: 10 | Status: AC

## 2016-11-29 MED FILL — Electrolyte-R (PH 7.4) Solution: INTRAVENOUS | Qty: 5000 | Status: AC
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
## 2016-11-29 NOTE — Clinical Social Work Note (Signed)
CSW acknowledges consult, "Patient has CP, S/P CABG today, [place] Patient has no other family and has no insurance." CSW following for PT recommendations. Also it was documented in his chart today that patient has [employer] Medicare.  Charlynn CourtSarah Kassia Demarinis, CSW [phone]

## 2016-11-29 NOTE — Progress Notes (Signed)
Patient ID: Bryce Ramsey, male   DOB: 07/27/1955, 61 y.o.   MRN: [id number] TCTS DAILY ICU PROGRESS NOTE                   [address]            [place] [address]          [phone]   1 Day Post-Op Procedure(s) (LRB): CORONARY ARTERY BYPASS GRAFTING (CABG) x four, using left internal mammary artery and bilateral upper thighs saphenous vein harvested endoscopically - LIMA to LAD, -SVG to DIAGONAL, SVG to CIRCUMFLEX, -SVG to RCA  (N/A) TRANSESOPHAGEAL ECHOCARDIOGRAM (TEE) (N/A)  Total Length of Stay:  LOS: 2 days   Subjective: Awake and alert neuro intact  Objective: Vital signs in last 24 hours: Temp:  [96.8 F (36 C)-100.9 F (38.3 C)] 98.8 F (37.1 C) (06/22 0615) Pulse Rate:  [79-90] 83 (06/22 0615) Cardiac Rhythm: Normal sinus rhythm (06/22 0400) Resp:  [12-25] 23 (06/22 0615) BP: (85-149)/(58-97) 117/79 (06/22 0615) SpO2:  [91 %-100 %] 96 % (06/22 0615) Arterial Line BP: (77-155)/(40-84) 107/57 (06/22 0615) FiO2 (%):  [40 %-50 %] 40 % (06/21 1713) Weight:  [184 lb 1.4 oz (83.5 kg)] 184 lb 1.4 oz (83.5 kg) (06/22 0500)  Filed Weights   11/27/16 1211 11/28/16 0417 11/29/16 0500  Weight: 168 lb (76.2 kg) 165 lb 6.4 oz (75 kg) 184 lb 1.4 oz (83.5 kg)    Weight change: 16 lb 1.4 oz (7.296 kg)   Hemodynamic parameters for last 24 hours: PAP: (24-47)/(6-27) 27/8 CO:  [3.8 L/min-7.7 L/min] 6.2 L/min CI:  [2.1 L/min/m2-4.2 L/min/m2] 3.4 L/min/m2  Intake/Output from previous day: 06/21 0701 - 06/22 0700 In: 6827.5 [P.O.:290; I.V.:5062.5; Blood:525; IV Piggyback:950] Out: 5780 [Urine:3800; Blood:1420; Chest Tube:560]  Intake/Output this shift: No intake/output data recorded.  Current Meds: Scheduled Meds: . acetaminophen  1,000 mg Oral Q6H   Or  . acetaminophen (TYLENOL) oral liquid 160 mg/5 mL  1,000 mg Per Tube Q6H  . aspirin EC  325 mg Oral Daily   Or  . aspirin  324 mg Per Tube Daily  . atorvastatin  80 mg Oral q1800  . bisacodyl  10 mg  Oral Daily   Or  . bisacodyl  10 mg Rectal Daily  . chlorhexidine gluconate (MEDLINE KIT)  15 mL Mouth Rinse BID  . Chlorhexidine Gluconate Cloth  6 each Topical Daily  . docusate sodium  200 mg Oral Daily  . insulin aspart  0-24 Units Subcutaneous Q4H  . mouth rinse  15 mL Mouth Rinse QID  . metoCLOPramide (REGLAN) injection  10 mg Intravenous Q6H  . metoprolol tartrate  12.5 mg Oral BID   Or  . metoprolol tartrate  12.5 mg Per Tube BID  . mupirocin ointment  1 application Nasal BID  . [START ON 11/30/2016] pantoprazole  40 mg Oral Daily  . sodium chloride flush  3 mL Intravenous Q12H  . terbinafine  250 mg Oral Daily   Continuous Infusions: . sodium chloride 20 mL/hr at 11/29/16 0600  . sodium chloride    . sodium chloride 10 mL/hr at 11/29/16 0400  . albumin human    . cefUROXime (ZINACEF)  IV Stopped (11/29/16 0016)  . dexmedetomidine (PRECEDEX) IV infusion Stopped (11/28/16 1736)  . lactated ringers    . lactated ringers    . lactated ringers 20 mL/hr at 11/29/16 0600  . nitroGLYCERIN Stopped (11/28/16 1330)  . phenylephrine (NEO-SYNEPHRINE) Adult infusion 30  mcg/min (11/29/16 0640)   PRN Meds:.sodium chloride, albumin human, lactated ringers, metoprolol tartrate, midazolam, morphine injection, ondansetron (ZOFRAN) IV, oxyCODONE, sodium chloride flush, traMADol  General appearance: alert and cooperative Neurologic: intact Heart: regular rate and rhythm, S1, S2 normal, no murmur, click, rub or gallop Lungs: diminished breath sounds bibasilar Abdomen: soft, non-tender; bowel sounds normal; no masses,  no organomegaly Extremities: extremities normal, atraumatic, no cyanosis or edema and Homans sign is negative, no sign of DVT Wound: sternum stable  Lab Results: CBC: Recent Labs  11/28/16 1950 11/28/16 2001 11/29/16 0427  WBC 14.9*  --  21.0*  HGB 11.8* 10.9* 11.3*  HCT 36.1* 32.0* 35.0*  PLT 198  --  220   BMET:  Recent Labs  11/27/16 1253  11/28/16 2001  11/29/16 0427  NA 136  < > 140 140  K 5.0  < > 4.1 4.0  CL 106  < > 108 109  CO2 21*  --   --  25  GLUCOSE 101*  < > 125* 132*  BUN 15  < > 8 8  CREATININE 0.87  < > 0.70 0.87  CALCIUM 8.9  --   --  7.5*  < > = values in this interval not displayed.  CMET: Lab Results  Component Value Date   WBC 21.0 (H) 11/29/2016   HGB 11.3 (L) 11/29/2016   HCT 35.0 (L) 11/29/2016   PLT 220 11/29/2016   GLUCOSE 132 (H) 11/29/2016   ALT 21 11/27/2016   AST 40 11/27/2016   NA 140 11/29/2016   K 4.0 11/29/2016   CL 109 11/29/2016   CREATININE 0.87 11/29/2016   BUN 8 11/29/2016   CO2 25 11/29/2016   INR 1.36 11/28/2016   HGBA1C 5.7 (H) 11/27/2016      PT/INR:  Recent Labs  11/28/16 1340  LABPROT 16.9*  INR 1.36   Radiology: Dg Chest Port 1 View  Result Date: 11/28/2016 CLINICAL DATA:  CABG. EXAM: PORTABLE CHEST 1 VIEW COMPARISON:  11/27/2016. FINDINGS: Endotracheal tube noted with its tip 4 cm above the carina. NG tube noted with tip below left hemidiaphragm. Swan-Ganz catheter with tip over right main pulmonary artery. Mediastinal drainage catheter and left chest tube in good anatomic position. No pneumothorax. Prior CABG. Cardiomegaly. No pulmonary venous congestion. No focal infiltrate. Small left pleural effusion. No pneumothorax. IMPRESSION: 1. Prior CABG.  Mild cardiomegaly.  No CHF. 2. Lines and tubes including left chest tube as above. Small left pleural effusion . No evidence of pneumothorax. Electronically Signed   By: Marcello Moores  Register   On: 11/28/2016 13:51     Assessment/Plan: S/P Procedure(s) (LRB): CORONARY ARTERY BYPASS GRAFTING (CABG) x four, using left internal mammary artery and bilateral upper thighs saphenous vein harvested endoscopically - LIMA to LAD, -SVG to DIAGONAL, SVG to CIRCUMFLEX, -SVG to RCA  (N/A) TRANSESOPHAGEAL ECHOCARDIOGRAM (TEE) (N/A) Mobilize Diuresis Diabetes control d/c tubes/lines See progression orders Expected Acute  Blood - loss  Anemia    Grace Isaac 11/29/2016 7:14 AM

## 2016-11-29 NOTE — Evaluation (Signed)
Physical Therapy Evaluation Patient Details Name: Bryce Ramsey P Crisanti MRN: [id number] DOB: 05-Jun-1956 Today's Date: 11/29/2016   History of Present Illness  61 year old male patient admitted 11/24/16 c/o of chest pain s/p CABG x4 11/29/16. PMH of hypertension, hyperlipidemia, chronic right and left foot pain with chronic cellulitis, GERD, cerebral palsy, and low back pain  Clinical Impression  Patient is s/p above surgery resulting in functional limitations due to the deficits listed below (see PT Problem List). Pt is limited in mobility by decreased LE strength R>L secondary to CP. Pt bed mobility currently maxAx1, and stand pivot transfer to recliner MaxAx3.  Patient will benefit from skilled PT to increase their independence and safety with mobility to allow discharge to the venue listed below.       Follow Up Recommendations SNF    Equipment Recommendations  Other (comment) (to be determined at next venue)    Recommendations for Other Services       Precautions / Restrictions Precautions Precautions: Sternal Restrictions Weight Bearing Restrictions: Yes Other Position/Activity Restrictions:  (sternal precautions)      Mobility  Bed Mobility Overal bed mobility: Needs Assistance Bed Mobility: Supine to Sit     Supine to sit: Max assist     General bed mobility comments: LE mangement to floor  Transfers Overall transfer level: Needs assistance Equipment used: None Transfers: Stand Pivot Transfers   Stand pivot transfers: Total assist       General transfer comment: maxAx3 secondary to LE weakness, pt able to flex trunk            Balance Overall balance assessment: Needs assistance Sitting-balance support: No upper extremity supported Sitting balance-Leahy Scale: Fair Sitting balance - Comments: sat EoB 6 min no LoB                                     Pertinent Vitals/Pain Pain Assessment: 0-10 Pain Score: 5  Pain Location: chest Pain  Descriptors / Indicators: Throbbing;Sharp;Grimacing;Guarding Pain Intervention(s): Monitored during session    Home Living Family/patient expects to be discharged to:: Private residence Living Arrangements: Alone   Type of Home: House Home Access: Stairs to enter Entrance Stairs-Rails: Can reach both Entrance Stairs-Number of Steps: 5 Home Layout: One level Home Equipment: Cane - single point;Hand held shower head      Prior Function Level of Independence: Independent         Comments: community ambulator        Extremity/Trunk Assessment   Upper Extremity Assessment Upper Extremity Assessment: Defer to OT evaluation    Lower Extremity Assessment Lower Extremity Assessment: RLE deficits/detail;LLE deficits/detail RLE Deficits / Details: R LE strength grossly 2/5  LLE Deficits / Details: L LE grossly 3/5    Cervical / Trunk Assessment Cervical / Trunk Assessment: Kyphotic  Communication   Communication: No difficulties  Cognition Arousal/Alertness: Awake/alert Behavior During Therapy: WFL for tasks assessed/performed Overall Cognitive Status: Within Functional Limits for tasks assessed                                        General Comments General comments (skin integrity, edema, etc.): Pt on 3 L O2 via nasal cannula at rest SaO2 95%O2, BP 137/87, HR 101, RR 22, after transfer SaO2 96% O2, HR 115 bpm, RR 26 Pt educated on sternal precautions and  able to recall 3/3 at end of session.         Assessment/Plan    PT Assessment Patient needs continued PT services  PT Problem List Decreased strength;Decreased range of motion;Decreased activity tolerance;Decreased balance;Decreased mobility;Decreased coordination;Decreased cognition;Pain       PT Treatment Interventions DME instruction;Gait training;Functional mobility training;Therapeutic activities;Therapeutic exercise;Balance training;Patient/family education    PT Goals (Current goals can be  found in the Care Plan section)  Acute Rehab PT Goals Patient Stated Goal: go home PT Goal Formulation: With patient Time For Goal Achievement: 12/13/16 Potential to Achieve Goals: Fair    Frequency Min 3X/week    AM-PAC PT "6 Clicks" Daily Activity  Outcome Measure Difficulty turning over in bed (including adjusting bedclothes, sheets and blankets)?: Total Difficulty moving from lying on back to sitting on the side of the bed? : Total Difficulty sitting down on and standing up from a chair with arms (e.g., wheelchair, bedside commode, etc,.)?: Total Help needed moving to and from a bed to chair (including a wheelchair)?: Total Help needed walking in hospital room?: Total Help needed climbing 3-5 steps with a railing? : Total 6 Click Score: 6    End of Session Equipment Utilized During Treatment: Oxygen Activity Tolerance: Patient tolerated treatment well Patient left: in chair;with call bell/phone within reach Nurse Communication: Mobility status;Need for lift equipment PT Visit Diagnosis: Muscle weakness (generalized) (M62.81);Difficulty in walking, not elsewhere classified (R26.2);Other abnormalities of gait and mobility (R26.89);Pain Pain - part of body:  (chest)    Time: 1610-9604 PT Time Calculation (min) (ACUTE ONLY): 49 min   Charges:   PT Evaluation $PT Eval Moderate Complexity: 1 Procedure PT Treatments $Therapeutic Activity: 23-37 mins   PT G Codes:        Mariacristina Aday B. Beverely Risen PT, DPT Acute Rehabilitation  [phone] Pager [phone]    Elon Alas [facility] 11/29/2016, 3:47 PM
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
## 2016-11-29 NOTE — Progress Notes (Signed)
Pt with frequent cough after sips of liquid and bits of jelly, SLP placed per MD order, pt NPO until evaluation done, will continue to monitor.   Bryce Ramsey,[name] 9:40 AM

## 2016-11-29 NOTE — Progress Notes (Signed)
CT surgery p.m. Rounds  Patient examined and record reviewed.Hemodynamics stable,labs satisfactory.Patient had stable day.Continue current care. Peter Van Trigt III 11/29/2016   

## 2016-11-29 NOTE — Progress Notes (Signed)
Dr. Donata ClayVan Trigt updated on pt's decreased UOP 10-20 cc/hour. Verbal order received to administer 20 mg IV lasix once. Will implement and continue to monitor.

## 2016-11-30 ENCOUNTER — Inpatient Hospital Stay (HOSPITAL_COMMUNITY): Payer: Medicare HMO

## 2016-11-30 LAB — CBC
HCT: 34.2 % — ABNORMAL LOW (ref 39.0–52.0)
Hemoglobin: 11.1 g/dL — ABNORMAL LOW (ref 13.0–17.0)
MCH: 29.9 pg (ref 26.0–34.0)
MCHC: 32.5 g/dL (ref 30.0–36.0)
MCV: 92.2 fL (ref 78.0–100.0)
Platelets: 192 10*3/uL (ref 150–400)
RBC: 3.71 MIL/uL — ABNORMAL LOW (ref 4.22–5.81)
RDW: 13.7 % (ref 11.5–15.5)
WBC: 17.7 10*3/uL — ABNORMAL HIGH (ref 4.0–10.5)

## 2016-11-30 LAB — GLUCOSE, CAPILLARY
GLUCOSE-CAPILLARY: 142 mg/dL — AB (ref 65–99)
Glucose-Capillary: 111 mg/dL — ABNORMAL HIGH (ref 65–99)
Glucose-Capillary: 135 mg/dL — ABNORMAL HIGH (ref 65–99)

## 2016-11-30 LAB — BASIC METABOLIC PANEL
Anion gap: 7 (ref 5–15)
BUN: 6 mg/dL (ref 6–20)
CO2: 26 mmol/L (ref 22–32)
Calcium: 7.9 mg/dL — ABNORMAL LOW (ref 8.9–10.3)
Chloride: 103 mmol/L (ref 101–111)
Creatinine, Ser: 0.86 mg/dL (ref 0.61–1.24)
GFR calc Af Amer: >60 mL/min (ref >60)
GFR calc non Af Amer: >60 mL/min (ref >60)
Glucose, Bld: 124 mg/dL — ABNORMAL HIGH (ref 65–99)
Potassium: 3.8 mmol/L (ref 3.5–5.1)
Sodium: 136 mmol/L (ref 135–145)

## 2016-11-30 MED ORDER — FUROSEMIDE 10 MG/ML IJ SOLN
20.0000 mg | Freq: Every day | INTRAMUSCULAR | Status: DC
  Administered 2016-11-30: 20 mg via INTRAVENOUS
  Filled 2016-11-30 (×2): qty 2

## 2016-11-30 MED ORDER — SODIUM CHLORIDE 0.9% FLUSH
3.0000 mL | Freq: Two times a day (BID) | INTRAVENOUS | Status: DC
  Administered 2016-11-30 – 2016-12-02 (×5): 3 mL via INTRAVENOUS

## 2016-11-30 MED ORDER — INSULIN ASPART 100 UNIT/ML ~~LOC~~ SOLN
0.0000 [IU] | Freq: Three times a day (TID) | SUBCUTANEOUS | Status: DC
  Administered 2016-11-30 – 2016-12-01 (×4): 2 [IU] via SUBCUTANEOUS

## 2016-11-30 MED ORDER — SODIUM CHLORIDE 0.9 % IV SOLN: 250.0000 mL | INTRAVENOUS | Status: DC | PRN

## 2016-11-30 MED ORDER — SODIUM CHLORIDE 0.9% FLUSH: 3.0000 mL | INTRAVENOUS | Status: DC | PRN

## 2016-11-30 MED ORDER — MOVING RIGHT ALONG BOOK
Freq: Once | Status: AC
  Administered 2016-11-30: 12:00:00
  Filled 2016-11-30: qty 1

## 2016-11-30 MED ORDER — ALUM & MAG HYDROXIDE-SIMETH 200-200-20 MG/5ML PO SUSP: 15.0000 mL | ORAL | Status: DC | PRN

## 2016-11-30 MED ORDER — INSULIN ASPART 100 UNIT/ML ~~LOC~~ SOLN: 0.0000 [IU] | SUBCUTANEOUS | Status: DC

## 2016-11-30 MED ORDER — POTASSIUM CHLORIDE CRYS ER 20 MEQ PO TBCR
20.0000 meq | EXTENDED_RELEASE_TABLET | Freq: Every day | ORAL | Status: DC
  Administered 2016-11-30: 20 meq via ORAL
  Filled 2016-11-30 (×2): qty 1

## 2016-11-30 NOTE — Progress Notes (Signed)
2 Days Post-Op Procedure(s) (LRB): CORONARY ARTERY BYPASS GRAFTING (CABG) x four, using left internal mammary artery and bilateral upper thighs saphenous vein harvested endoscopically - LIMA to LAD, -SVG to DIAGONAL, SVG to CIRCUMFLEX, -SVG to RCA  (N/A) TRANSESOPHAGEAL ECHOCARDIOGRAM (TEE) (N/A) Subjective: \progressing Transfer orders in place Ambulating OOB  Objective: Vital signs in last 24 hours: Temp:  [97.3 F (36.3 C)-99 F (37.2 C)] 98.5 F (36.9 C) (06/23 1137) Pulse Rate:  [84-120] 84 (06/23 1100) Cardiac Rhythm: Sinus tachycardia (06/23 0800) Resp:  [18-27] 18 (06/23 1100) BP: (107-146)/(73-86) 122/81 (06/23 1100) SpO2:  [90 %-97 %] 94 % (06/23 1100) Weight:  [180 lb 12.4 oz (82 kg)] 180 lb 12.4 oz (82 kg) (06/23 0600)  Hemodynamic parameters for last 24 hours:   stable Intake/Output from previous day: 06/22 0701 - 06/23 0700 In: 378.3 [I.V.:278.3; IV Piggyback:100] Out: 1725 [Urine:1435; Chest Tube:290] Intake/Output this shift: Total I/O In: 93 [P.O.:60; I.V.:33] Out: 115 [Urine:105; Chest Tube:10]       Exam    General- alert and comfortable   Lungs- clear without rales, wheezes   Cor- regular rate and rhythm, no murmur , gallop   Abdomen- soft, non-tender   Extremities - warm, non-tender, minimal edema   Neuro- oriented, appropriate, no focal weakness   Lab Results:  Recent Labs  11/29/16 1658 11/29/16 1714 11/30/16 0354  WBC 18.7*  --  17.7*  HGB 11.6* 11.9* 11.1*  HCT 35.7* 35.0* 34.2*  PLT 212  --  192   BMET:  Recent Labs  11/29/16 0427  11/29/16 1714 11/30/16 0354  NA 140  --  139 136  K 4.0  --  3.9 3.8  CL 109  --  101 103  CO2 25  --   --  26  GLUCOSE 132*  --  177* 124*  BUN 8  --  8 6  CREATININE 0.87  < > 0.70 0.86  CALCIUM 7.5*  --   --  7.9*  < > = values in this interval not displayed.  PT/INR:  Recent Labs  11/28/16 1340  LABPROT 16.9*  INR 1.36   ABG    Component Value Date/Time   PHART 7.291 (L)  11/28/2016 1853   HCO3 20.4 11/28/2016 1853   TCO2 25 11/29/2016 1714   ACIDBASEDEF 6.0 (H) 11/28/2016 1853   O2SAT 91.0 11/28/2016 1853   CBG (last 3)   Recent Labs  11/29/16 1534 11/29/16 2201 11/30/16 0811  GLUCAP 177* 113* 111*    Assessment/Plan: S/P Procedure(s) (LRB): CORONARY ARTERY BYPASS GRAFTING (CABG) x four, using left internal mammary artery and bilateral upper thighs saphenous vein harvested endoscopically - LIMA to LAD, -SVG to DIAGONAL, SVG to CIRCUMFLEX, -SVG to RCA  (N/A) TRANSESOPHAGEAL ECHOCARDIOGRAM (TEE) (N/A) d/c tubes/lines Plan for transfer to step-down: see transfer orders   LOS: 3 days    [name] 11/30/2016

## 2016-11-30 NOTE — Clinical Social Work Note (Addendum)
Clinical Social Work Assessment  Patient Details  Name: Bryce Ramsey MRN: [id number] Date of Birth: Aug 28, 1955  Date of referral:  11/30/16               Reason for consult:  Facility Placement                Permission sought to share information with:  Facility Art therapist granted to share information::  Yes, Verbal Permission Granted  Name::        Agency::  SNF  Relationship::     Contact Information:     Housing/Transportation Living arrangements for the past 2 months:  Single Family Home Source of Information:  Patient Patient Interpreter Needed:  None Criminal Activity/Legal Involvement Pertinent to Current Situation/Hospitalization:  No - Comment as needed Significant Relationships:  Spouse Lives with:  Self Do you feel safe going back to the place where you live?  Yes Need for family participation in patient care:  No (Coment)  Care giving concerns:  Patient resided at home alone prior to hospitalization. Patient identifies support from his church members. Pt indicated that [place] in [place] and he does not want to go there, possibly.  Patient may not be safe to return home at this time however spouse indicated that the members of his church were considering if they could provide support in home with home services as well.  Social Worker assessment / plan: CSW met with patient to discuss recommendations from clinical team for discharge plan.  CSW discussed her role and SNF options/placment. Pt not sure if he will go home with home health and have church members assist or go to SNF in the [place] area. CSW will hold off on sending out offers at this time. CSW will continue to monitor status.  FL2 and passr to be completed and obtained. No SNF offers sent at this time.  Employment status:  Retired Nurse, adult PT Recommendations:  [place] / Referral to community  resources:  [place]  Patient/Family's Response to care:  Patient appreciative of CSW initiation of SNF and discussing future DC plans.  No issues or concerns at this time.  Patient/Family's Understanding of and Emotional Response to Diagnosis, Current Treatment, and Prognosis:  Patient appears to have good understanding of his diagnosis, current treatment and prognosis. Pt is hopeful that he might be able to go home at DC with home services vs. SNF. Pt has no issues or concerns at this time.  Emotional Assessment Appearance:  Appears stated age Attitude/Demeanor/Rapport:   (Cooperative) Affect (typically observed):  Accepting, Appropriate Orientation:  Oriented to Situation, Oriented to  Time, Oriented to Place, Oriented to Self Alcohol / Substance use:  Not Applicable Psych involvement (Current and /or in the community):  No (Comment)  Discharge Needs  Concerns to be addressed:  Care Coordination Readmission within the last 30 days:  No Current discharge risk:  Physical Impairment, Dependent with Mobility Barriers to Discharge:  No Barriers Identified   Bryce Baxter, LCSW 11/30/2016, 11:22 AM

## 2016-11-30 NOTE — Progress Notes (Signed)
CT Surgery Patient examined and record reviewed.Hemodynamics stable,labs satisfactory.Patient had stable day.Continue current care. [name] 11/30/2016

## 2016-11-30 NOTE — Progress Notes (Signed)
  Speech Language Pathology Treatment: Dysphagia  Patient Details Name: Bryce ShelterDale P Ramsey MRN: [id number] DOB: Jan 21, 1956 Today's Date: 11/30/2016 Time: 0850-0910 SLP Time Calculation (min) (ACUTE ONLY): 20 min  Assessment / Plan / Recommendation Clinical Impression  Pt with improved phonation today and swallow returning to near baseline per his statement.  NO indication of aspiration/airway compromise with small cup boluses of thin, nectar, applesauce, graham cracker and ice chips.  Pt did demonstrate overt cough immediately post=swallow of thin water via straw - presumed due to premature spillage of water into airway.  Also subtle throat clearing noted approximately 30 seconds after 3 ounce water testing.  Pt did report sensation of pharyngeal stasis with nectar liquids - suspect referrant to esophagus given h/o reflux/esophageal dysphagia.   Recommend advance diet as tolerated - SlP will follow up x1 more to assure tolerance, educate pt to precautions and determine instrumental evaluation indication.  Thanks for allowing us to help care for this pt.   HPI HPI: The patient is a 61 year old male with history of Cerebral palsy, hypertension, hyperlipidemia, chronic right and left foot pain with chronic cellulitis, GERD, cerebral palsy, and low back pain that [facility] with a a few days of intermittent chest pain per MD note.  On Sunday the patient experienced pain down his left arm and more intense chest pain, therefore he had a friend took him to the hospital.  On 11/26/2016 he had a cardiac catheterization done which revealed severe Left main and proximal right CAD. [facility] was [facility] - for consideration of CABG.  Pt underwent CABG on 11/28/16.  Post op pt noted to cough with intake and swallow evaluation was ordered.   Pt imaging study showed low lung volumes/ATX.        SLP Plan   Continue care plan       Recommendations   Diet recommendations: Other(comment) (return to clear liquid diet) Liquids provided via: Cup Medication Administration: Whole meds with puree Supervision: Patient able to self feed Compensations: Slow rate;Small sips/bites                SLP Visit Diagnosis: Dysphagia, pharyngoesophageal phase (R13.14)       GO                Mills KollerKimball, Chalise Pe Ann Rhodes [place], MS [facility] SLP [phone]

## 2016-12-01 ENCOUNTER — Inpatient Hospital Stay (HOSPITAL_COMMUNITY): Payer: Medicare HMO

## 2016-12-01 LAB — GLUCOSE, CAPILLARY
Glucose-Capillary: 93 mg/dL (ref 65–99)
Glucose-Capillary: 120 mg/dL — ABNORMAL HIGH (ref 65–99)
Glucose-Capillary: 107 mg/dL — ABNORMAL HIGH (ref 65–99)
Glucose-Capillary: 99 mg/dL (ref 65–99)

## 2016-12-01 LAB — BASIC METABOLIC PANEL
Anion gap: 4 — ABNORMAL LOW (ref 5–15)
BUN: 8 mg/dL (ref 6–20)
CO2: 29 mmol/L (ref 22–32)
Calcium: 8 mg/dL — ABNORMAL LOW (ref 8.9–10.3)
Chloride: 101 mmol/L (ref 101–111)
Creatinine, Ser: 0.89 mg/dL (ref 0.61–1.24)
GFR calc Af Amer: >60 mL/min (ref >60)
GFR calc non Af Amer: >60 mL/min (ref >60)
Glucose, Bld: 105 mg/dL — ABNORMAL HIGH (ref 65–99)
Potassium: 3.8 mmol/L (ref 3.5–5.1)
Sodium: 134 mmol/L — ABNORMAL LOW (ref 135–145)

## 2016-12-01 LAB — CBC
HCT: 33.9 % — ABNORMAL LOW (ref 39.0–52.0)
Hemoglobin: 11 g/dL — ABNORMAL LOW (ref 13.0–17.0)
MCH: 30.4 pg (ref 26.0–34.0)
MCHC: 32.4 g/dL (ref 30.0–36.0)
MCV: 93.6 fL (ref 78.0–100.0)
Platelets: 181 10*3/uL (ref 150–400)
RBC: 3.62 MIL/uL — ABNORMAL LOW (ref 4.22–5.81)
RDW: 14.6 % (ref 11.5–15.5)
WBC: 14.8 10*3/uL — ABNORMAL HIGH (ref 4.0–10.5)

## 2016-12-01 MED ORDER — POTASSIUM CHLORIDE CRYS ER 20 MEQ PO TBCR
20.0000 meq | EXTENDED_RELEASE_TABLET | Freq: Two times a day (BID) | ORAL | Status: DC
  Administered 2016-12-01 – 2016-12-03 (×5): 20 meq via ORAL
  Filled 2016-12-01 (×5): qty 1

## 2016-12-01 MED ORDER — FUROSEMIDE 10 MG/ML IJ SOLN
40.0000 mg | Freq: Every day | INTRAMUSCULAR | Status: DC
  Administered 2016-12-01: 40 mg via INTRAVENOUS
  Filled 2016-12-01: qty 4

## 2016-12-01 NOTE — Progress Notes (Signed)
SLP Cancellation Note  Patient Details Name: Bryce Ramsey MRN: [id number] DOB: Aug 24, 1955   Cancelled treatment:       Reason Eval/Treat Not Completed: Other (comment) (pt sound asleep - RN reports good tolerance of po at this time)   Donavan Burnetamara Council Munguia, MS [facility] SLP [phone]

## 2016-12-01 NOTE — Progress Notes (Signed)
3 Days Post-Op Procedure(s) (LRB): CORONARY ARTERY BYPASS GRAFTING (CABG) x four, using left internal mammary artery and bilateral upper thighs saphenous vein harvested endoscopically - LIMA to LAD, -SVG to DIAGONAL, SVG to CIRCUMFLEX, -SVG to RCA  (N/A) TRANSESOPHAGEAL ECHOCARDIOGRAM (TEE) (N/A) Subjective: Progressing but needs assist due to cerebral; palsy At home usually 8000 steps per day Objective: Vital signs in last 24 hours: Temp:  [98.5 F (36.9 C)-99.2 F (37.3 C)] 98.8 F (37.1 C) (06/24 0400) Pulse Rate:  [72-120] 86 (06/24 0800) Cardiac Rhythm: Normal sinus rhythm (06/24 0727) Resp:  [0-27] 10 (06/24 0800) BP: (92-161)/(64-86) 145/80 (06/24 0700) SpO2:  [90 %-96 %] 96 % (06/24 0800) Weight:  [178 lb 9.2 oz (81 kg)] 178 lb 9.2 oz (81 kg) (06/24 0500)  Hemodynamic parameters for last 24 hours:  nsr  Intake/Output from previous day: 06/23 0701 - 06/24 0700 In: 983 [P.O.:900; I.V.:33; IV Piggyback:50] Out: 1355 [Urine:1315; Chest Tube:40] Intake/Output this shift: No intake/output data recorded.       Exam    General- alert and comfortable   Lungs- clear without rales, wheezes   Cor- regular rate and rhythm, no murmur , gallop   Abdomen- soft, non-tender   Extremities - warm, non-tender, minimal edema   Neuro- oriented, appropriate, no focal weakness   Lab Results:  Recent Labs  11/30/16 0354 12/01/16 0410  WBC 17.7* 14.8*  HGB 11.1* 11.0*  HCT 34.2* 33.9*  PLT 192 181   BMET:  Recent Labs  11/30/16 0354 12/01/16 0547  NA 136 134*  K 3.8 3.8  CL 103 101  CO2 26 29  GLUCOSE 124* 105*  BUN 6 8  CREATININE 0.86 0.89  CALCIUM 7.9* 8.0*    PT/INR:  Recent Labs  11/28/16 1340  LABPROT 16.9*  INR 1.36   ABG    Component Value Date/Time   PHART 7.291 (L) 11/28/2016 1853   HCO3 20.4 11/28/2016 1853   TCO2 25 11/29/2016 1714   ACIDBASEDEF 6.0 (H) 11/28/2016 1853   O2SAT 91.0 11/28/2016 1853   CBG (last 3)   Recent Labs   11/30/16 1136 11/30/16 1700 12/01/16 0807  GLUCAP 135* 142* 93    Assessment/Plan: S/P Procedure(s) (LRB): CORONARY ARTERY BYPASS GRAFTING (CABG) x four, using left internal mammary artery and bilateral upper thighs saphenous vein harvested endoscopically - LIMA to LAD, -SVG to DIAGONAL, SVG to CIRCUMFLEX, -SVG to RCA  (N/A) TRANSESOPHAGEAL ECHOCARDIOGRAM (TEE) (N/A) Mobilize Diuresis Diabetes control Plan for transfer to step-down: see transfer orders Iv lasix for fluid retention  LOS: 4 days    Bryce Ramsey 12/01/2016

## 2016-12-01 NOTE — Progress Notes (Signed)
Pt arrived to 2w from 2h. Telemetry box applied and CCMD notified. Pt oriented to room and staff. Pt denies needs at this time. Will continue current plan of care.  Maylie Ashton Moffitt BSN, RN 

## 2016-12-02 ENCOUNTER — Inpatient Hospital Stay (HOSPITAL_COMMUNITY): Payer: Medicare HMO

## 2016-12-02 DIAGNOSIS — Z951 Presence of aortocoronary bypass graft: Secondary | ICD-10-CM

## 2016-12-02 DIAGNOSIS — L0390 Cellulitis, unspecified: Secondary | ICD-10-CM

## 2016-12-02 DIAGNOSIS — I25810 Atherosclerosis of coronary artery bypass graft(s) without angina pectoris: Secondary | ICD-10-CM

## 2016-12-02 DIAGNOSIS — D72829 Elevated white blood cell count, unspecified: Secondary | ICD-10-CM

## 2016-12-02 DIAGNOSIS — D62 Acute posthemorrhagic anemia: Secondary | ICD-10-CM

## 2016-12-02 DIAGNOSIS — G801 Spastic diplegic cerebral palsy: Secondary | ICD-10-CM

## 2016-12-02 LAB — CBC
HCT: 35.1 % — ABNORMAL LOW (ref 39.0–52.0)
Hemoglobin: 11.3 g/dL — ABNORMAL LOW (ref 13.0–17.0)
MCH: 29.5 pg (ref 26.0–34.0)
MCHC: 32.2 g/dL (ref 30.0–36.0)
MCV: 91.6 fL (ref 78.0–100.0)
Platelets: 308 10*3/uL (ref 150–400)
RBC: 3.83 MIL/uL — ABNORMAL LOW (ref 4.22–5.81)
RDW: 13.6 % (ref 11.5–15.5)
WBC: 13 10*3/uL — ABNORMAL HIGH (ref 4.0–10.5)

## 2016-12-02 LAB — GLUCOSE, CAPILLARY
GLUCOSE-CAPILLARY: 170 mg/dL — AB (ref 65–99)
Glucose-Capillary: 133 mg/dL — ABNORMAL HIGH (ref 65–99)
Glucose-Capillary: 102 mg/dL — ABNORMAL HIGH (ref 65–99)
Glucose-Capillary: 129 mg/dL — ABNORMAL HIGH (ref 65–99)
Glucose-Capillary: 135 mg/dL — ABNORMAL HIGH (ref 65–99)

## 2016-12-02 LAB — BASIC METABOLIC PANEL
Anion gap: 7 (ref 5–15)
BUN: 10 mg/dL (ref 6–20)
CO2: 30 mmol/L (ref 22–32)
Calcium: 8 mg/dL — ABNORMAL LOW (ref 8.9–10.3)
Chloride: 101 mmol/L (ref 101–111)
Creatinine, Ser: 0.91 mg/dL (ref 0.61–1.24)
GFR calc Af Amer: >60 mL/min (ref >60)
GFR calc non Af Amer: >60 mL/min (ref >60)
Glucose, Bld: 119 mg/dL — ABNORMAL HIGH (ref 65–99)
Potassium: 3.7 mmol/L (ref 3.5–5.1)
Sodium: 138 mmol/L (ref 135–145)

## 2016-12-02 MED ORDER — LOSARTAN POTASSIUM 25 MG PO TABS
25.0000 mg | ORAL_TABLET | Freq: Every day | ORAL | Status: DC
  Administered 2016-12-02 – 2016-12-03 (×2): 25 mg via ORAL
  Filled 2016-12-02 (×2): qty 1

## 2016-12-02 MED ORDER — FUROSEMIDE 40 MG PO TABS
40.0000 mg | ORAL_TABLET | Freq: Every day | ORAL | Status: DC
  Administered 2016-12-02 – 2016-12-03 (×2): 40 mg via ORAL
  Filled 2016-12-02 (×2): qty 1

## 2016-12-02 NOTE — Progress Notes (Signed)
Inpatient Rehabilitation  Met with patient to discuss team's recommendation for IP Rehab.  Shared booklets and answered questions.  Received notification that following completion of consult therapists feel that patient could tolerate and make good, meaningful gains with lower extremities.    Additionally, attempted to check benefits and unable to due to insurance having the wrong DOB.  Notified patient and requested that he call and change it in the system.    Plan to follow for timing of medical readiness, insurance authorization, and bed availability.  Please call with questions.    Carmelia Roller., CCC/SLP Admission Coordinator  [place]  Cell [phone]

## 2016-12-02 NOTE — Progress Notes (Signed)
Physical Therapy Treatment Patient Details Name: Bryce Ramsey MRN: [id number] DOB: [birth year] Today's Date: 12/02/2016    History of Present Illness 61 year old male patient admitted 11/24/16 c/o of chest pain s/p CABG x4 11/29/16. PMH of hypertension, hyperlipidemia, chronic right and left foot pain with chronic cellulitis, GERD, cerebral palsy, and low back pain    PT Comments    Pt performed mobility with better carryover and required decreased assistance.  Pt required +2 external assistance for functional transfers but able to advance mobility to gait training.  Pt reports he has help from church with activities around the home and for bringing his groceries.  Pt has made improved progress and will update recommendations to CIR at this time.  Plan next session for functional training to improve transfers as this remains his largest deficit.     Follow Up Recommendations  CIR      Equipment Recommendations  Other (comment) (TBD)    Recommendations for Other Services       Precautions / Restrictions Precautions Precautions: Sternal Restrictions Weight Bearing Restrictions: Yes    Mobility  Bed Mobility               General bed mobility comments: Pt sitting edge of bed on arrival.    Transfers Overall transfer level: Needs assistance Equipment used: Rolling walker (2 wheeled) Transfers: Sit to/from [employer] Transfers Sit to Stand: Mod assist;+2 physical assistance Stand pivot transfers: Mod assist       General transfer comment: Pt required max VCs for avoiding to push with B UEs.  Pt required use of his heart pillow to maintain sternal precautions during sit to stand.  Pt required boosting into standing from bed and from recliner chair.    Ambulation/Gait Ambulation/Gait assistance: Min assist Ambulation Distance (Feet): 80 Feet (+ steps from bed to chair.  ) Assistive device: Rolling walker (2 wheeled) (Educated patient to use RW for balance vs. pushing  down through for support.  ) Gait Pattern/deviations: Step-to pattern;Shuffle;Trunk flexed;Decreased dorsiflexion - left;Decreased dorsiflexion - right;Festinating   Gait velocity interpretation: Below normal speed for age/gender General Gait Details: Pt required cues for upper trunk control and for RW safety.  Pt required min assist to turn RW.  Cues to increased B stride length.  Pt chronically ambulates on his toes (Toe walking) due to his CP.     Stairs            Wheelchair Mobility    Modified Rankin (Stroke Patients Only)       Balance Overall balance assessment: Needs assistance Sitting-balance support: No upper extremity supported Sitting balance-Leahy Scale: Fair       Standing balance-Leahy Scale: Fair Standing balance comment: Able to perform ADLs at sink with min guard assist.                              Cognition Arousal/Alertness: Awake/alert Behavior During Therapy: WFL for tasks assessed/performed Overall Cognitive Status: Within Functional Limits for tasks assessed                                        Exercises      General Comments        Pertinent Vitals/Pain Pain Assessment: No/denies pain Pain Score: 3  Pain Location: chest Pain Descriptors / Indicators: Sore Pain Intervention(s): Monitored during session;Repositioned  Home Living                      Prior Function            PT Goals (current goals can now be found in the care plan section) Acute Rehab PT Goals Patient Stated Goal: go home Potential to Achieve Goals: Fair Progress towards PT goals: Progressing toward goals    Frequency    Min 3X/week      PT Plan Discharge plan needs to be updated    Co-evaluation PT/OT/SLP Co-Evaluation/Treatment: Yes Reason for Co-Treatment: Complexity of the patient's impairments (multi-system involvement);To address functional/ADL transfers PT goals addressed during session:  Mobility/safety with mobility;Balance;Proper use of DME OT goals addressed during session: ADL's and self-care;Proper use of Adaptive equipment and DME      AM-PAC PT "6 Clicks" Daily Activity  Outcome Measure  Difficulty turning over in bed (including adjusting bedclothes, sheets and blankets)?: Total Difficulty moving from lying on back to sitting on the side of the bed? : Total Difficulty sitting down on and standing up from a chair with arms (e.g., wheelchair, bedside commode, etc,.)?: Total Help needed moving to and from a bed to chair (including a wheelchair)?: A Lot Help needed walking in hospital room?: A Little Help needed climbing 3-5 steps with a railing? : A Lot 6 Click Score: 10    End of Session Equipment Utilized During Treatment: Gait belt Activity Tolerance: Patient tolerated treatment well Patient left: in chair;with call bell/phone within reach Nurse Communication: Mobility status;Need for lift equipment PT Visit Diagnosis: Muscle weakness (generalized) (M62.81);Difficulty in walking, not elsewhere classified (R26.2);Other abnormalities of gait and mobility (R26.89);Pain Pain - part of body:  (chest)     Time: 1336-1410 PT Time Calculation (min) (ACUTE ONLY): 34 min  Charges:  $Gait Training: 8-22 mins                    G Codes:       Joycelyn RuaAimee Loudon Krakow, PTA pager [phone]    Florestine AversAimee J Jaiceon Collister 12/02/2016, 2:25 PM

## 2016-12-02 NOTE — Care Management Important Message (Signed)
Important Message  Patient Details  Name: Bryce Ramsey MRN: [id number] Date of Birth: [birth year]   Medicare Important Message Given:  Yes    Kyla BalzarineShealy, Chaise Mahabir Abena 12/02/2016, 10:57 AM

## 2016-12-02 NOTE — Progress Notes (Signed)
Patient EPW pulled per protocol and as ordered. All ends intact. Patient reminded to stay in bed for one hour. Pt bp 120/83 99 heart rate on monitor. Will continue to monitor patient. Nicolaas Savo, Randall AnKristin Jessup RN

## 2016-12-02 NOTE — Evaluation (Signed)
Occupational Therapy Evaluation Patient Details Name: Bryce Ramsey MRN: [id number] DOB: 1956-02-05 Today's Date: 12/02/2016    History of Present Illness 61 year old male patient admitted 11/24/16 c/o of chest pain s/p CABG x4 11/29/16. PMH of hypertension, hyperlipidemia, chronic right and left foot pain with chronic cellulitis, GERD, cerebral palsy, and low back pain   Clinical Impression   Pt lived alone and was independent. He avoided socks and wore sandals year round due to difficulty donning. Pt presents with generalized weakness, requiring 2 person assist for sit to stand. He was able to recall 1 sternal precaution. Pt was able to ambulate and stand at the sink for 2 grooming activities. Pt has good rehab potential and availability of help from his church for IADL upon d/c. Recommending CIR. Will follow.    Follow Up Recommendations  CIR    Equipment Recommendations       Recommendations for Other Services Rehab consult     Precautions / Restrictions Precautions Precautions: Sternal;Fall Restrictions Weight Bearing Restrictions: Yes      Mobility Bed Mobility               General bed mobility comments: Pt sitting edge of bed on arrival.    Transfers Overall transfer level: Needs assistance Equipment used: Rolling walker (2 wheeled) Transfers: Sit to/from [employer] Transfers Sit to Stand: Mod assist;+2 physical assistance Stand pivot transfers: Min assist       General transfer comment: Pt required max VCs for avoiding to push with B UEs.  Pt required use of his heart pillow to maintain sternal precautions during sit to stand.  Pt required boosting into standing from bed and from recliner chair.      Balance Overall balance assessment: Needs assistance Sitting-balance support: No upper extremity supported Sitting balance-Leahy Scale: Fair       Standing balance-Leahy Scale: Fair Standing balance comment: Able to perform ADLs at sink with min guard  assist and pericare with one hand on walker.                             ADL either performed or assessed with clinical judgement   ADL Overall ADL's : Needs assistance/impaired Eating/Feeding: Independent;Sitting   Grooming: Brushing hair;Standing;Oral care;Minimal assistance   Upper Body Bathing: Minimal assistance;Sitting   Lower Body Bathing: Maximal assistance;Sit to/from stand   Upper Body Dressing : Minimal assistance;Sitting   Lower Body Dressing: Maximal assistance;Sit to/from stand   Toilet Transfer: Minimal assistance;Ambulation;RW   Toileting- Clothing Manipulation and Hygiene: Moderate assistance;Sit to/from stand       Functional mobility during ADLs: Minimal assistance;Rolling walker General ADL Comments: Pt needing cues for sternal precautions, able to name 1     Vision Patient Visual Report: No change from baseline       Perception     Praxis      Pertinent Vitals/Pain Pain Assessment: Faces Pain Score: 3  Faces Pain Scale: Hurts little more Pain Location: chest incision, L heel Pain Descriptors / Indicators: Sore Pain Intervention(s): Monitored during session;Repositioned     Hand Dominance Right   Extremity/Trunk Assessment Upper Extremity Assessment Upper Extremity Assessment: Overall WFL for tasks assessed   Lower Extremity Assessment Lower Extremity Assessment: Defer to PT evaluation   Cervical / Trunk Assessment Cervical / Trunk Assessment: Kyphotic (forward head)   Communication Communication Communication: No difficulties   Cognition Arousal/Alertness: Awake/alert Behavior During Therapy: WFL for tasks assessed/performed Overall Cognitive Status: Within  Functional Limits for tasks assessed                                     General Comments       Exercises     Shoulder Instructions      Home Living Family/patient expects to be discharged to:: Private residence Living Arrangements:  Alone Available Help at Discharge: Friend(s);Available PRN/intermittently (church members) Type of Home: House Home Access: Stairs to enter Secretary/administratorntrance Stairs-Number of Steps: 5 Entrance Stairs-Rails: Can reach both Home Layout: One level     Bathroom Shower/Tub: IT trainerTub/shower unit;Curtain   Bathroom Toilet: Standard Bathroom Accessibility: No   Home Equipment: Cane - single point;Hand held shower head          Prior Functioning/Environment Level of Independence: Independent        Comments: community ambulator, drives, pt avoids socks and wears sandals year round        OT Problem List: Decreased strength;Decreased activity tolerance;Impaired balance (sitting and/or standing);Decreased coordination;Decreased knowledge of use of DME or AE;Decreased knowledge of precautions;Pain      OT Treatment/Interventions: Self-care/ADL training;DME and/or AE instruction;Therapeutic activities;Patient/family education;Balance training    OT Goals(Current goals can be found in the care plan section) Acute Rehab OT Goals Patient Stated Goal: go home OT Goal Formulation: With patient Time For Goal Achievement: 12/16/16 Potential to Achieve Goals: Good ADL Goals Pt Will Perform Grooming: with supervision;standing Pt Will Perform Lower Body Bathing: with min assist;with adaptive equipment;sit to/from stand Pt Will Perform Lower Body Dressing: with min assist;with adaptive equipment;sit to/from stand Pt Will Transfer to Toilet: with supervision;ambulating;bedside commode (over toilet) Pt Will Perform Toileting - Clothing Manipulation and hygiene: with supervision;sit to/from stand Additional ADL Goal #1: Pt will generalize sternal precautions in ADL and mobility. Additional ADL Goal #2: Pt will perform bed mobility with supervision.  OT Frequency: Min 2X/week   Barriers to D/C:            Co-evaluation PT/OT/SLP Co-Evaluation/Treatment: Yes Reason for Co-Treatment: For  patient/therapist safety;Complexity of the patient's impairments (multi-system involvement) PT goals addressed during session: Mobility/safety with mobility;Balance;Proper use of DME OT goals addressed during session: ADL's and self-care      AM-PAC PT "6 Clicks" Daily Activity     Outcome Measure Help from another person eating meals?: None Help from another person taking care of personal grooming?: A Little Help from another person toileting, which includes using toliet, bedpan, or urinal?: A Little Help from another person bathing (including washing, rinsing, drying)?: A Lot Help from another person to put on and taking off regular upper body clothing?: A Little Help from another person to put on and taking off regular lower body clothing?: A Lot 6 Click Score: 17   End of Session Equipment Utilized During Treatment: Rolling walker;Gait belt Nurse Communication:  (ok to leave off 02)  Activity Tolerance: Patient tolerated treatment well (stable vitals throughout, 02 sats in mid 90s on RA) Patient left: in chair;with call bell/phone within reach;with chair alarm set  OT Visit Diagnosis: Unsteadiness on feet (R26.81);Muscle weakness (generalized) (M62.81);Other abnormalities of gait and mobility (R26.89);Pain                Time: 1334-1406 OT Time Calculation (min): 32 min Charges:  OT General Charges $OT Visit: 1 Procedure OT Evaluation $OT Eval Moderate Complexity: 1 Procedure G-Codes:      Evern BioMayberry, Jaylee Freeze Lynn 12/02/2016, 3:23 PM  [phone] 
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
## 2016-12-02 NOTE — Progress Notes (Addendum)
      [address]       [place] [id number]             [phone]      4 Days Post-Op Procedure(s) (LRB): CORONARY ARTERY BYPASS GRAFTING (CABG) x four, using left internal mammary artery and bilateral upper thighs saphenous vein harvested endoscopically - LIMA to LAD, -SVG to DIAGONAL, SVG to CIRCUMFLEX, -SVG to RCA  (N/A) TRANSESOPHAGEAL ECHOCARDIOGRAM (TEE) (N/A)   Subjective:  [name] has no complaints this morning.  He states he is doing okay.  + BM  Objective: Vital signs in last 24 hours: Temp:  [98.4 F (36.9 C)-99.3 F (37.4 C)] 99.3 F (37.4 C) (06/25 0612) Pulse Rate:  [78-98] 98 (06/25 0612) Cardiac Rhythm: Normal sinus rhythm (06/25 0700) Resp:  [14-19] 18 (06/25 0612) BP: (102-172)/(65-101) 149/91 (06/25 0612) SpO2:  [92 %-100 %] 96 % (06/25 0612) Weight:  [175 lb 1.6 oz (79.4 kg)] 175 lb 1.6 oz (79.4 kg) (06/25 0612)  Intake/Output from previous day: 06/24 0701 - 06/25 0700 In: 720 [P.O.:720] Out: 2325 [Urine:2325]  General appearance: alert, cooperative and no distress Heart: regular rate and rhythm Lungs: clear to auscultation bilaterally Abdomen: soft, non-tender; bowel sounds normal; no masses,  no organomegaly Extremities: edema trace Wound: clean and dry  Lab Results:  Recent Labs  12/01/16 0410 12/02/16 0308  WBC 14.8* 13.0*  HGB 11.0* 11.3*  HCT 33.9* 35.1*  PLT 181 308   BMET:  Recent Labs  12/01/16 0547 12/02/16 0308  NA 134* 138  K 3.8 3.7  CL 101 101  CO2 29 30  GLUCOSE 105* 119*  BUN 8 10  CREATININE 0.89 0.91  CALCIUM 8.0* 8.0*    PT/INR: No results for input(s): LABPROT, INR in the last 72 hours. ABG    Component Value Date/Time   PHART 7.291 (L) 11/28/2016 1853   HCO3 20.4 11/28/2016 1853   TCO2 25 11/29/2016 1714   ACIDBASEDEF 6.0 (H) 11/28/2016 1853   O2SAT 91.0 11/28/2016 1853   CBG (last 3)   Recent Labs  12/01/16 1630 12/01/16 2207 12/02/16 0610  GLUCAP 107* 99 102*     Assessment/Plan: S/P Procedure(s) (LRB): CORONARY ARTERY BYPASS GRAFTING (CABG) x four, using left internal mammary artery and bilateral upper thighs saphenous vein harvested endoscopically - LIMA to LAD, -SVG to DIAGONAL, SVG to CIRCUMFLEX, -SVG to RCA  (N/A) TRANSESOPHAGEAL ECHOCARDIOGRAM (TEE) (N/A)  1. CV- NSR, occasional PVC, mild HTN- continue Lopressor at 12.5 mg BID, restart home Cozaar 2. Pulm- wean oxygen as tolerated, CXR with small bilateral effusions 3. Renal- creatinine stable, weight remains overloaded, continue Lasix 4. CBGs- controlled, not a diabetic will d/c SSIP 5. Expected post operative blood loss anemia, stable Hgb at 11.3 6. Deconditioning- patient with CP, lives alone, no help at discharge, will need CIR/SNF at discharge 7. Dispo- patient stable, CIR consult, d/c EPW today, Cozaar for BP, possibly ready for d/c in next 24-48 hrs   LOS: 5 days    BARRETT, ERIN 12/02/2016 Slowly improving, ambulating Consider ip rehab or snf/rehab I have seen and examined Bryce Ramsey and agree with the above assessment  and plan.  Delight OvensEdward B Shraga Custard MD [facility] [phone] Office [phone] 12/02/2016 2:30 PM

## 2016-12-03 MED ORDER — OXYCODONE HCL 5 MG PO TABS: 5.0000 mg | ORAL_TABLET | Freq: Four times a day (QID) | ORAL | 0 refills | Status: AC | PRN

## 2016-12-03 MED ORDER — METOPROLOL TARTRATE 25 MG PO TABS
25.0000 mg | ORAL_TABLET | Freq: Two times a day (BID) | ORAL | Status: DC
  Administered 2016-12-03: 25 mg via ORAL
  Filled 2016-12-03: qty 1

## 2016-12-03 MED ORDER — METOPROLOL TARTRATE 25 MG PO TABS: 25.0000 mg | ORAL_TABLET | Freq: Two times a day (BID) | ORAL | Status: AC

## 2016-12-03 MED ORDER — POTASSIUM CHLORIDE CRYS ER 20 MEQ PO TBCR: 20.0000 meq | EXTENDED_RELEASE_TABLET | Freq: Every day | ORAL | 0 refills | Status: AC

## 2016-12-03 MED ORDER — FUROSEMIDE 40 MG PO TABS: 40.0000 mg | ORAL_TABLET | Freq: Every day | ORAL | 0 refills | Status: AC

## 2016-12-03 MED ORDER — PANTOPRAZOLE SODIUM 40 MG PO TBEC
40.0000 mg | DELAYED_RELEASE_TABLET | Freq: Every day | ORAL | Status: AC
Start: 2016-12-03 — End: ?

## 2016-12-03 NOTE — Progress Notes (Signed)
      [address]       [place] [id number]             [phone]      5 Days Post-Op Procedure(s) (LRB): CORONARY ARTERY BYPASS GRAFTING (CABG) x four, using left internal mammary artery and bilateral upper thighs saphenous vein harvested endoscopically - LIMA to LAD, -SVG to DIAGONAL, SVG to CIRCUMFLEX, -SVG to RCA  (N/A) TRANSESOPHAGEAL ECHOCARDIOGRAM (TEE) (N/A)   Subjective:  No complaints.  Rehab does not feel patient is appropriate for admission, they have recommended SNF.  Spoke with patient he is agreeable but does not wish to go to the same facility as his wife.  Objective: Vital signs in last 24 hours: Temp:  [97.8 F (36.6 C)-99 F (37.2 C)] 97.8 F (36.6 C) (06/26 0542) Pulse Rate:  [82-99] 82 (06/26 0542) Cardiac Rhythm: Normal sinus rhythm (06/26 0717) Resp:  [18] 18 (06/26 0542) BP: (92-160)/(69-94) 121/76 (06/26 0542) SpO2:  [94 %] 94 % (06/26 0542) Weight:  [174 lb 8 oz (79.2 kg)] 174 lb 8 oz (79.2 kg) (06/26 0542)  Intake/Output from previous day: 06/25 0701 - 06/26 0700 In: 1110 [P.O.:1110] Out: 1625 [Urine:1625]  General appearance: alert, cooperative and no distress Heart: regular rate and rhythm Lungs: clear to auscultation bilaterally Abdomen: soft, non-tender; bowel sounds normal; no masses,  no organomegaly Extremities: edema trace Wound: clean and dry  Lab Results:  Recent Labs  12/01/16 0410 12/02/16 0308  WBC 14.8* 13.0*  HGB 11.0* 11.3*  HCT 33.9* 35.1*  PLT 181 308   BMET:  Recent Labs  12/01/16 0547 12/02/16 0308  NA 134* 138  K 3.8 3.7  CL 101 101  CO2 29 30  GLUCOSE 105* 119*  BUN 8 10  CREATININE 0.89 0.91  CALCIUM 8.0* 8.0*    PT/INR: No results for input(s): LABPROT, INR in the last 72 hours. ABG    Component Value Date/Time   PHART 7.291 (L) 11/28/2016 1853   HCO3 20.4 11/28/2016 1853   TCO2 25 11/29/2016 1714   ACIDBASEDEF 6.0 (H) 11/28/2016 1853   O2SAT 91.0 11/28/2016 1853   CBG  (last 3)   Recent Labs  12/02/16 1120 12/02/16 1631 12/02/16 2114  GLUCAP 129* 170* 135*    Assessment/Plan: S/P Procedure(s) (LRB): CORONARY ARTERY BYPASS GRAFTING (CABG) x four, using left internal mammary artery and bilateral upper thighs saphenous vein harvested endoscopically - LIMA to LAD, -SVG to DIAGONAL, SVG to CIRCUMFLEX, -SVG to RCA  (N/A) TRANSESOPHAGEAL ECHOCARDIOGRAM (TEE) (N/A)  1. CV- NSR, BP remains mildly elevated at times- increase Lopressor to 25 mg BID, continue Cozaar 2. Pulm- no acute issues, off oxygen continue IS 3. Renal- creatinine stable, weight is stable, continue Lasix 4. Deconditioning- CIR does not feel patient is candidate for placement, will work on SNF placement 5. Dispo- patient is stable, medically ready for d/c once SNF can be arranged   LOS: 6 days    Lowella DandyBARRETT, Morrell Fluke 12/03/2016

## 2016-12-03 NOTE — Clinical Social Work Placement (Signed)
   CLINICAL SOCIAL WORK PLACEMENT  NOTE  Date:  12/03/2016  Patient Details  Name: Bryce Ramsey MRN: [id number] Date of Birth: 1955/08/03  Clinical Social Work is seeking post-discharge placement for this patient at the Skilled  Nursing Facility level of care (*CSW will initial, date and re-position this form in  chart as items are completed):  Yes   Patient/family provided with [facility] Clinical Social Work Department's list of facilities offering this level of care within the geographic area requested by the patient (or if unable, by the patient's family).  Yes   Patient/family informed of their freedom to choose among providers that offer the needed level of care, that participate in Medicare, Medicaid or managed care program needed by the patient, have an available bed and are willing to accept the patient.  Yes   Patient/family informed of [facility]'s ownership interest in [facility] and [facility], as well as of the fact that they are under no obligation to receive care at these facilities.  PASRR submitted to EDS on       PASRR number received on 12/03/16     Existing PASRR number confirmed on       FL2 transmitted to all facilities in geographic area requested by pt/family on       FL2 transmitted to all facilities within larger geographic area on       Patient informed that his/her managed care company has contracts with or will negotiate with certain facilities, including the following:        Yes   Patient/family informed of bed offers received.  [facility]     Physician recommends and patient chooses bed at      [facility].  Patient to be transferred to facility by [facility]     Patient family notified on   of transfer.  Name of family member notified:  pt stated family does not need to be called as he has already done so  himself     PHYSICIAN       Additional Comment:    _______________________________________________ Althea CharonAshley C Mancil Pfenning, LCSW 12/03/2016, 2:27 PM

## 2016-12-03 NOTE — Progress Notes (Signed)
1310-1350 Discussed sternal precautions, IS and walking as tolerated with assistance. Wrote down how to view discharge video. Discussed CRP 2 and will [facility]. Discussed heart healthy diet. Pt voiced understanding.  Had come at 1100 to walk and pt on phone with Rehab facilities so returned to ed. Luetta NuttingCharlene Kelissa Merlin RN BSN 12/03/2016 1:55 PM

## 2016-12-03 NOTE — Discharge Summary (Signed)
Physician Discharge Summary  Patient ID: Bryce Ramsey MRN: [id number] DOB/AGE: 02/24/1956 61 y.o.  Admit date: 11/27/2016 Discharge date: 12/03/2016  Admission Diagnoses:Non-STEMI  Discharge Diagnoses:  Active Problems:   Coronary artery disease   Hx of CABG   S/P CABG x 4   Leukocytosis   Acute blood loss anemia  Patient Active Problem List   Diagnosis Date Noted  . Hx of CABG   . S/P CABG x 4   . Leukocytosis   . Acute blood loss anemia   . Chronic cellulitis 11/27/2016  . Gait abnormality 11/27/2016  . GERD (gastroesophageal reflux disease) 11/27/2016  . Coronary artery disease 11/27/2016  . Chronic pain in left foot 12/09/2015  . Chronic pain in right foot 12/09/2015  . Foot deformity, bilateral 12/09/2015  . Cerebral palsy (HCC) 08/30/2015  . Acid reflux 08/19/2015  . Acquired deformity of foot 08/10/2015  . Bursitis 08/10/2015  . Enthesopathy of ankle and tarsus 08/10/2015  . Hyperlipidemia 08/10/2015  . Hypertension 08/10/2015  . Low back pain 08/10/2015  . Tendonitis of shoulder 08/10/2015  . Essential hypertension 04/28/2013   History of Present Illness:     This is a 61 year old male patient with a past medical history of hypertension, hyperlipidemia, chronic right and left foot pain with chronic cellulitis, GERD, cerebral palsy, and low back pain that presented to [facility] with a a few days of intermittent chest pain. The chest pain generally lasted about 15-20 minutes. On Sunday the patient experienced pain down his left arm and more intense chest pain, therefore he had a friend drive him to the hospital. On admission an echocardiogram was obtained which revealed no valvular disease and a normal ejection fraction. On 11/26/2016 he had a cardiac catheterization done which revealed severe Left main and proximal right CAD. He was transferred to [facility] for revascularization today for consideration of CABG.   The patient generally gets  along well at home. He occasionally has to use a cane for uneven surfaces and steep hills. He is married ( his wife is amputee and in SNF)  and retired. 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 He has never smoked   Discharged Condition: good  Hospital Course: The patient was admitted in transfer from [facility]. He was felt to be an adequate surgical candidate and on 11/28/2016 was taken to the operating room where he underwent the below described procedure. He tolerated well was taken to the surgical intensive care unit in stable condition. X  Postoperative hospital course:  The patient has overall progressed well. He was weaned from the ventilator using standard postoperative protocols. All routine lines, monitors and drainage devices have been discontinued in the standard fashion. He has a mild expected acute blood loss anemia which is stable. Lites, renal function is within normal limits. He did have some postoperative volume overload but is responding well to diuretics. Incisions are noted be healing well without evidence of infection. He is tolerating diet. He is tolerating gradually increasing activities using usual cardiac rehabilitation protocols. Oxygen has been weaned and he maintains good saturations on room air. He has maintained normal sinus rhythm. Hemodynamically he is stable and tolerating both beta blocker and ARB. He is also noted to have been started on aspirin as well as a statin. At time of discharge she is felt to be quite stable. He is going to SNF for further rehabilitation   Consults: None  Significant Diagnostic Studies: routine post-op  labs/serial CXR's  Treatments: surgery:  DATE OF PROCEDURE:  11/28/2016 DATE OF DISCHARGE:                              OPERATIVE REPORT   POSTOPERATIVE DIAGNOSIS:  Critical left main and proximal right coronary artery disease with recent non-ST-elevation myocardial infarction.  POSTOPERATIVE DIAGNOSIS:   Critical left main and proximal right coronary artery disease with recent non-ST-elevation myocardial infarction.  SURGICAL PROCEDURE:  Emergent coronary artery bypass grafting x4 with left internal mammary to the left anterior descending coronary artery, reverse saphenous vein graft to the diagonal coronary artery, reverse saphenous vein graft to the circumflex coronary artery, reverse saphenous vein graft to the distal right coronary artery with bilateral thigh endo vein harvesting.  SURGEON:  Sheliah Plane, MD  FIRST ASSISTANT:  Lowella Dandy PA. Discharge Exam: Blood pressure 121/76, pulse 82, temperature 97.8 F (36.6 C), temperature source Oral, resp. rate 18, height 5\' 6"  (1.676 m), weight 174 lb 8 oz (79.2 kg), SpO2 94 %.  General appearance: alert, cooperative and no distress Heart: regular rate and rhythm Lungs: clear to auscultation bilaterally Abdomen: soft, non-tender; bowel sounds normal; no masses,  no organomegaly Extremities: edema trace Wound: clean and dry   Disposition: Final discharge disposition not confirmed  Discharge Instructions    Ambulatory referral to Physical Medicine Rehab    Complete by:  As directed    1 month CP follow up     Allergies as of 12/03/2016      Reactions   No Known Allergies       Medication List    TAKE these medications   atorvastatin 80 MG tablet Commonly known as:  LIPITOR Take 80 mg by mouth daily at 6 PM.   furosemide 40 MG tablet Commonly known as:  LASIX Take 1 tablet (40 mg total) by mouth daily. Start taking on:  12/04/2016   GOODSENSE ASPIRIN 325 MG tablet Generic drug:  aspirin Take 325 mg by mouth daily.   losartan 25 MG tablet Commonly known as:  COZAAR Take 25 mg by mouth daily.   metoprolol tartrate 25 MG tablet Commonly known as:  LOPRESSOR Take 1 tablet (25 mg total) by mouth 2 (two) times daily. What changed:  when to take this   oxyCODONE 5 MG immediate release tablet Commonly known  as:  Oxy IR/ROXICODONE Take 1-2 tablets (5-10 mg total) by mouth every 6 (six) hours as needed for severe pain.   pantoprazole 40 MG tablet Commonly known as:  PROTONIX Take 1 tablet (40 mg total) by mouth daily. What changed:  when to take this   potassium chloride SA 20 MEQ tablet Commonly known as:  K-DUR,KLOR-CON Take 1 tablet (20 mEq total) by mouth daily.       Contact information for follow-up providers    Delight Ovens, MD Follow up.   Specialty:  Cardiothoracic Surgery Why:  01/09/2017 at 2:30 to see Dr Tyrone Sage. Please obtain a chest Xray at [facility] imaging at 2:00. It is located in the same office complex. Contact information: [address] [place] [address] [phone]        Caryl Ada, MD Follow up.   Specialty:  Cardiology Why:  Please arrange a 2 week follow-up with your cardiologist. Contact information: [address] [place] [address] [phone]        Caryl Ada, MD Follow up.  Specialty:  Cardiology Contact information: [address] [place] [id number] [phone]            Contact information for after-discharge care    Destination    [facility] Follow up.   Specialty:  Skilled Nursing Facility Contact information: [address] [place] [phone]                 The patient has been discharged on:   1.Beta Blocker:  Yes [ y  ]                              No   [   ]                              If No, reason:  2.Ace Inhibitor/ARB: Yes [ y  ]                                     No  [    ]                                     If No, reason:  3.Statin:   Yes [ y  ]                  No  [   ]                  If No, reason:  4.Ecasa:  Yes  [ y  ]                  No   [   ]                  If No, reason:   1. Please obtain vital signs at least one time daily 2.Please weigh the  patient daily. If he or she continues to gain weight or develops lower extremity edema, contact the office at [phone]. 3. Ambulate patient at least three times daily and please use sternal precautions.    SignedGershon Crane: Shuntia Exton E 12/03/2016, 1:36 PM

## 2016-12-03 NOTE — Progress Notes (Signed)
[facility]   Leonidas Rombergaitlin S Bumbledare, RN

## 2016-12-03 NOTE — Progress Notes (Signed)
CT sutures removed per protocol. IV removed. Telemetry removed.  Report called to [facility]. All questions answered. Gave call back number for questions.   Leonidas Rombergaitlin S Bumbledare, RN

## 2016-12-03 NOTE — Care Management Note (Signed)
Case Management Note Donn PieriniKristi Dominique Calvey RN, BSN Unit 2W-Case Manager-- 2H coverage [phone]  Patient Details  Name: Bryce Ramsey MRN: [id number] Date of Birth: 1956/05/05  Subjective/Objective:  Pt admitted s/p CABG 11/28/16                  Action/Plan: PTA Pt lived at home alone- referral received regarding possible SNF need- per note pt's wife is in a SNF- and pt does not have any other family - will consult CSW to assist in social assessment- and follow pt progression post op  Expected Discharge Date:  12/03/16               Expected Discharge Plan:  Skilled Nursing Facility  In-House Referral:  Clinical Social Work  Discharge planning Services  CM Consult  Post Acute Care Choice:  NA Choice offered to:  NA  DME Arranged:    DME Agency:     HH Arranged:    HH Agency:     Status of Service:  Completed, signed off  If discussed at [employer] Length of Stay Meetings, dates discussed:    Discharge Disposition: skilled facility   Additional Comments:  12/02/16- 1400- Bryce Ziemba RN, CM- pt for d/c today to SNF- confirmed with CIR this AM that pt was not going to be offered a bed for CIR- plan for SNF- CSW working on placement for pt- pt medically stable for d/c today.   Darrold SpanWebster, Bryce Harrow Hall, RN 12/03/2016, 2:12 PM

## 2016-12-03 NOTE — Progress Notes (Signed)
Clinical Social Worker facilitated patient discharge including contacting patient family and facility to confirm patient discharge plans.  Clinical information faxed to facility and family agreeable with plan.  CSW arranged ambulance transport via [facility] to [facility] .  RN to call [phone] (pt will go in rm#[phone]) for report prior to discharge.  Clinical Social Worker will sign off for now as social work intervention is no longer needed. Please consult us again if new need arises.  Marrianne MoodAshley Lasean Rahming, MSW, [employer] [phone]

## 2016-12-03 NOTE — Discharge Instructions (Signed)
Endoscopic Saphenous Vein Harvesting, Care After °Refer to this sheet in the next few weeks. These instructions provide you with information about caring for yourself after your procedure. Your health care provider may also give you more specific instructions. Your treatment has been planned according to current medical practices, but problems sometimes occur. Call your health care provider if you have any problems or questions after your procedure. °What can I expect after the procedure? °After the procedure, it is common to have: °· Pain. °· Bruising. °· Swelling. °· Numbness. ° °Follow these instructions at home: °Medicine °· Take over-the-counter and prescription medicines only as told by your health care provider. °· Do not drive or operate heavy machinery while taking prescription pain medicine. °Incision care ° °· Follow instructions from your health care provider about how to take care of the cut made during surgery (incision). Make sure you: °? Wash your hands with soap and water before you change your bandage (dressing). If soap and water are not available, use hand sanitizer. °? Change your dressing as told by your health care provider. °? Leave stitches (sutures), skin glue, or adhesive strips in place. These skin closures may need to be in place for 2 weeks or longer. If adhesive strip edges start to loosen and curl up, you may trim the loose edges. Do not remove adhesive strips completely unless your health care provider tells you to do that. °· Check your incision area every day for signs of infection. Check for: °? More redness, swelling, or pain. °? More fluid or blood. °? Warmth. °? Pus or a bad smell. °General instructions °· Raise (elevate) your legs above the level of your heart while you are sitting or lying down. °· Do any exercises your health care providers have given you. These may include deep breathing, coughing, and walking exercises. °· Do not shower, take baths, swim, or use a hot tub  unless told by your health care provider. °· Wear your elastic stocking if told by your health care provider. °· Keep all follow-up visits as told by your health care provider. This is important. °Contact a health care provider if: °· Medicine does not help your pain. °· Your pain gets worse. °· You have new leg bruises or your leg bruises get bigger. °· You have a fever. °· Your leg feels numb. °· You have more redness, swelling, or pain around your incision. °· You have more fluid or blood coming from your incision. °· Your incision feels warm to the touch. °· You have pus or a bad smell coming from your incision. °Get help right away if: °· Your pain is severe. °· You develop pain, tenderness, warmth, redness, or swelling in any part of your leg. °· You have chest pain. °· You have trouble breathing. °This information is not intended to replace advice given to you by your health care provider. Make sure you discuss any questions you have with your health care provider. °Document Released: 02/06/2011 Document Revised: 11/02/2015 Document Reviewed: 04/10/2015 °Elsevier Interactive Patient Education © 2018 Elsevier Inc. °Coronary Artery Bypass Grafting, Care After °These instructions give you information on caring for yourself after your procedure. Your doctor may also give you more specific instructions. Call your doctor if you have any problems or questions after your procedure. °Follow these instructions at home: °· Only take medicine as told by your doctor. Take medicines exactly as told. Do not stop taking medicines or start any new medicines without talking to your doctor first. °·   Take your pulse as told by your doctor. °· Do deep breathing as told by your doctor. Use your breathing device (incentive spirometer), if given, to practice deep breathing several times a day. Support your chest with a pillow or your arms when you take deep breaths or cough. °· Keep the area clean, dry, and protected where the  surgery cuts (incisions) were made. Remove bandages (dressings) only as told by your doctor. If strips were applied to surgical area, do not take them off. They fall off on their own. °· Check the surgery area daily for puffiness (swelling), redness, or leaking fluid. °· If surgery cuts were made in your legs: °? Avoid crossing your legs. °? Avoid sitting for long periods of time. Change positions every 30 minutes. °? Raise your legs when you are sitting. Place them on pillows. °· Wear stockings that help keep blood clots from forming in your legs (compression stockings). °· Only take sponge baths until your doctor says it is okay to take showers. Pat the surgery area dry. Do not rub the surgery area with a washcloth or towel. Do not bathe, swim, or use a hot tub until your doctor says it is okay. °· Eat foods that are high in fiber. These include raw fruits and vegetables, whole grains, beans, and nuts. Choose lean meats. Avoid canned, processed, and fried foods. °· Drink enough fluids to keep your pee (urine) clear or pale yellow. °· Weigh yourself every day. °· Rest and limit activity as told by your doctor. You may be told to: °? Stop any activity if you have chest pain, shortness of breath, changes in heartbeat, or dizziness. Get help right away if this happens. °? Move around often for short amounts of time or take short walks as told by your doctor. Gradually become more active. You may need help to strengthen your muscles and build endurance. °? Avoid lifting, pushing, or pulling anything heavier than 10 pounds (4.5 kg) for at least 6 weeks after surgery. °· Do not drive until your doctor says it is okay. °· Ask your doctor when you can go back to work. °· Ask your doctor when you can begin sexual activity again. °· Follow up with your doctor as told. °Contact a doctor if: °· You have puffiness, redness, more pain, or fluid draining from the incision site. °· You have a fever. °· You have puffiness in your  ankles or legs. °· You have pain in your legs. °· You gain 2 or more pounds (0.9 kg) a day. °· You feel sick to your stomach (nauseous) or throw up (vomit). °· You have watery poop (diarrhea). °Get help right away if: °· You have chest pain that goes to your jaw or arms. °· You have shortness of breath. °· You have a fast or irregular heartbeat. °· You notice a "clicking" in your breastbone when you move. °· You have numbness or weakness in your arms or legs. °· You feel dizzy or light-headed. °This information is not intended to replace advice given to you by your health care provider. Make sure you discuss any questions you have with your health care provider. °Document Released: 06/01/2013 Document Revised: 11/02/2015 Document Reviewed: 11/03/2012 °Elsevier Interactive Patient Education © 2017 Elsevier Inc. ° °

## 2016-12-03 NOTE — Progress Notes (Signed)
Inpatient Rehabilitation  Per [employer] patient's DOB does not match our records.  As a result, I am unable to open case at this time.  Also note that patient is being discharged to SNF today.  Will sign off at this time.  Please call with questions.   Charlane FerrettiMelissa Cedrica Brune, M.A., CCC/SLP Admission Coordinator  [facility]  Cell [phone]

## 2016-12-03 NOTE — NC FL2 (Signed)
[place] LEVEL OF CARE SCREENING TOOL     IDENTIFICATION  Patient Name: Bryce Ramsey Birthdate: [birth year] Sex: male Admission Date (Current Location): 11/27/2016  [facility] and [place] Number:  Herbalist and Address:  The . [facility] [place] [address] [place], [place] [address]      Provider Number: [id number]  Attending Physician Name and Address:  Grace Isaac, MD  Relative Name and Phone Number:       Current Level of Care: Hospital Recommended Level of Care: [place] Prior Approval Number:    Date Approved/Denied:   PASRR Number: [id number]  Discharge Plan: SNF    Current Diagnoses: Patient Active Problem List   Diagnosis Date Noted  . Hx of CABG   . S/P CABG x 4   . Leukocytosis   . Acute blood loss anemia   . Chronic cellulitis 11/27/2016  . Gait abnormality 11/27/2016  . GERD (gastroesophageal reflux disease) 11/27/2016  . Coronary artery disease 11/27/2016  . Chronic pain in left foot 12/09/2015  . Chronic pain in right foot 12/09/2015  . Foot deformity, bilateral 12/09/2015  . Cerebral palsy ([place]) 08/30/2015  . Acid reflux 08/19/2015  . Acquired deformity of foot 08/10/2015  . Bursitis 08/10/2015  . Enthesopathy of ankle and tarsus 08/10/2015  . Hyperlipidemia 08/10/2015  . Hypertension 08/10/2015  . Low back pain 08/10/2015  . Tendonitis of shoulder 08/10/2015  . Essential hypertension 04/28/2013    Orientation RESPIRATION BLADDER Height & Weight     Self, Time, Situation, Place  Normal Continent, External catheter Weight: 174 lb 8 oz (79.2 kg) Height:  _0  (167.6 cm)  BEHAVIORAL SYMPTOMS/MOOD NEUROLOGICAL BOWEL NUTRITION STATUS      Continent Diet (See DC Summary)  AMBULATORY STATUS COMMUNICATION OF NEEDS Skin   Extensive Assist Verbally Surgical wounds (Right Leg Closed Incision with gauze; Chest Incision with Silver Hydrofiber)                       Personal Care  Assistance Level of Assistance  Bathing, Feeding, Dressing Bathing Assistance: Limited assistance Feeding assistance: Independent Dressing Assistance: Limited assistance     Functional Limitations Info  Sight, Hearing, Speech Sight Info: Adequate Hearing Info: Adequate Speech Info: Impaired    SPECIAL CARE FACTORS FREQUENCY  Speech therapy, OT (By licensed OT), PT (By licensed PT)     PT Frequency: 5xweek       Speech Therapy Frequency: 2xweek      Contractures Contractures Info: Not present    Additional Factors Info  Code Status, Allergies Code Status Info: Full Allergies Info: NKA           Current Medications (12/03/2016):  This is the current hospital active medication list Current Facility-Administered Medications  Medication Dose Route Frequency Provider Last Rate Last Dose  . 0.9 %  sodium chloride infusion  250 mL Intravenous Continuous Barrett, Erin R, PA-C      . 0.9 %  sodium chloride infusion  250 mL Intravenous PRN Prescott Gum, Collier Salina, MD      . acetaminophen (TYLENOL) tablet 1,000 mg  1,000 mg Oral Q6H Barrett, Erin R, PA-C   1,000 mg at 12/01/16 1752   Or  . acetaminophen (TYLENOL) solution 1,000 mg  1,000 mg Per Tube Q6H Barrett, Erin R, PA-C      . alum & mag hydroxide-simeth (MAALOX/MYLANTA) 200-200-20 MG/5ML suspension 15-30 mL  15-30 mL Oral Q4H PRN Prescott Gum,  Collier Salina, MD      . aspirin EC tablet 325 mg  325 mg Oral Daily Barrett, Erin R, PA-C   325 mg at 12/03/16 1056   Or  . aspirin chewable tablet 324 mg  324 mg Per Tube Daily Barrett, Erin R, PA-C   324 mg at 12/01/16 0949  . atorvastatin (LIPITOR) tablet 80 mg  80 mg Oral q1800 Elgie Collard, PA-C   80 mg at 12/02/16 1705  . bisacodyl (DULCOLAX) EC tablet 10 mg  10 mg Oral Daily Barrett, Erin R, PA-C   10 mg at 11/30/16 3419   Or  . bisacodyl (DULCOLAX) suppository 10 mg  10 mg Rectal Daily Barrett, Erin R, PA-C      . chlorhexidine gluconate (MEDLINE KIT) (PERIDEX) 0.12 % solution 15 mL  15 mL  Mouth Rinse BID Grace Isaac, MD   15 mL at 11/30/16 2000  . docusate sodium (COLACE) capsule 200 mg  200 mg Oral Daily Barrett, Erin R, PA-C   200 mg at 12/02/16 0951  . enoxaparin (LOVENOX) injection 30 mg  30 mg Subcutaneous QHS Grace Isaac, MD   30 mg at 12/02/16 2159  . furosemide (LASIX) tablet 40 mg  40 mg Oral Daily Barrett, Erin R, PA-C   40 mg at 12/03/16 1056  . losartan (COZAAR) tablet 25 mg  25 mg Oral Daily Barrett, Erin R, PA-C   25 mg at 12/03/16 1056  . MEDLINE mouth rinse  15 mL Mouth Rinse BID Grace Isaac, MD   15 mL at 12/02/16 1800  . metoprolol tartrate (LOPRESSOR) injection 2.5-5 mg  2.5-5 mg Intravenous Q2H PRN Barrett, Erin R, PA-C      . metoprolol tartrate (LOPRESSOR) tablet 25 mg  25 mg Oral BID Barrett, Erin R, PA-C   25 mg at 12/03/16 1056  . ondansetron (ZOFRAN) injection 4 mg  4 mg Intravenous Q6H PRN Barrett, Erin R, PA-C   4 mg at 11/29/16 1654  . oxyCODONE (Oxy IR/ROXICODONE) immediate release tablet 5-10 mg  5-10 mg Oral Q3H PRN Barrett, Erin R, PA-C   10 mg at 12/02/16 1705  . pantoprazole (PROTONIX) EC tablet 40 mg  40 mg Oral Daily Barrett, Erin R, PA-C   40 mg at 12/03/16 1056  . potassium chloride SA (K-DUR,KLOR-CON) CR tablet 20 mEq  20 mEq Oral BID Prescott Gum, Collier Salina, MD   20 mEq at 12/03/16 1056  . sodium chloride flush (NS) 0.9 % injection 3 mL  3 mL Intravenous Q12H Prescott Gum, Collier Salina, MD   3 mL at 12/02/16 2202  . sodium chloride flush (NS) 0.9 % injection 3 mL  3 mL Intravenous PRN Prescott Gum, Collier Salina, MD      . terbinafine (LAMISIL) tablet 250 mg  250 mg Oral Daily Nicholes Rough N, PA-C   250 mg at 12/03/16 1058  . traMADol (ULTRAM) tablet 50-100 mg  50-100 mg Oral Q4H PRN Barrett, Erin R, PA-C   100 mg at 12/02/16 2207     Discharge Medications: Please see discharge summary for a list of discharge medications.  Relevant Imaging Results:  Relevant Lab Results:   Additional Information [id number]  Wende Neighbors, LCSW

## 2016-12-05 ENCOUNTER — Other Ambulatory Visit (HOSPITAL_COMMUNITY): Payer: Self-pay | Admitting: Internal Medicine

## 2016-12-05 DIAGNOSIS — R1319 Other dysphagia: Secondary | ICD-10-CM

## 2016-12-16 ENCOUNTER — Other Ambulatory Visit (HOSPITAL_COMMUNITY): Payer: Medicare HMO

## 2016-12-16 ENCOUNTER — Ambulatory Visit (HOSPITAL_COMMUNITY): Payer: Medicare HMO

## 2016-12-19 DIAGNOSIS — Z48812 Encounter for surgical aftercare following surgery on the circulatory system: Secondary | ICD-10-CM | POA: Diagnosis not present

## 2016-12-25 ENCOUNTER — Telehealth: Payer: Self-pay

## 2016-12-25 NOTE — Telephone Encounter (Signed)
[facility] [phone] requesting Home PT for patient 2 x week for 3 weeks due to post op weak ness. I gave the ok verbal. They are also seeing Mr Shawnee KnappLevy for skilled nursing at home. He is scheduled to see Dr Tyrone SageGerhardt on 01/13/2017

## 2016-12-30 ENCOUNTER — Ambulatory Visit: Payer: Medicare HMO

## 2017-01-03 ENCOUNTER — Encounter: Payer: Medicare HMO | Admitting: Physical Medicine & Rehabilitation

## 2017-01-09 ENCOUNTER — Ambulatory Visit: Payer: Medicare HMO | Admitting: Cardiothoracic Surgery

## 2017-01-10 ENCOUNTER — Other Ambulatory Visit: Payer: Self-pay | Admitting: Cardiothoracic Surgery

## 2017-01-10 DIAGNOSIS — Z951 Presence of aortocoronary bypass graft: Secondary | ICD-10-CM

## 2017-01-13 ENCOUNTER — Ambulatory Visit: Payer: Self-pay

## 2017-01-16 ENCOUNTER — Encounter: Payer: Medicare HMO | Attending: Physical Medicine & Rehabilitation | Admitting: Physical Medicine & Rehabilitation

## 2017-01-17 ENCOUNTER — Other Ambulatory Visit: Payer: Self-pay | Admitting: Cardiothoracic Surgery

## 2017-01-17 DIAGNOSIS — Z951 Presence of aortocoronary bypass graft: Secondary | ICD-10-CM

## 2017-01-20 ENCOUNTER — Ambulatory Visit: Payer: Self-pay

## 2017-02-19 ENCOUNTER — Ambulatory Visit: Payer: Medicare HMO | Admitting: Cardiothoracic Surgery

## 2017-03-03 NOTE — Addendum Note (Signed)
Addendum  created 03/03/17 1300 by Gaynelle Adu, MD   Sign clinical note

## 2017-07-29 ENCOUNTER — Emergency Department (HOSPITAL_BASED_OUTPATIENT_CLINIC_OR_DEPARTMENT_OTHER)
Admission: EM | Admit: 2017-07-29 | Discharge: 2017-07-29 | Disposition: A | Discharge status: Home / Self Care | Payer: Self-pay | Attending: Emergency Medicine | Admitting: Emergency Medicine

## 2017-07-29 ENCOUNTER — Encounter (HOSPITAL_BASED_OUTPATIENT_CLINIC_OR_DEPARTMENT_OTHER): Payer: Self-pay | Admitting: Emergency Medicine

## 2017-07-29 ENCOUNTER — Other Ambulatory Visit: Payer: Self-pay

## 2017-07-29 ENCOUNTER — Emergency Department (HOSPITAL_BASED_OUTPATIENT_CLINIC_OR_DEPARTMENT_OTHER): Payer: Self-pay

## 2017-07-29 DIAGNOSIS — Z79899 Other long term (current) drug therapy: Secondary | ICD-10-CM | POA: Insufficient documentation

## 2017-07-29 DIAGNOSIS — J209 Acute bronchitis, unspecified: Secondary | ICD-10-CM | POA: Insufficient documentation

## 2017-07-29 DIAGNOSIS — I10 Essential (primary) hypertension: Secondary | ICD-10-CM | POA: Insufficient documentation

## 2017-07-29 DIAGNOSIS — I2510 Atherosclerotic heart disease of native coronary artery without angina pectoris: Secondary | ICD-10-CM | POA: Insufficient documentation

## 2017-07-29 DIAGNOSIS — Z951 Presence of aortocoronary bypass graft: Secondary | ICD-10-CM | POA: Insufficient documentation

## 2017-07-29 MED ORDER — AZITHROMYCIN 250 MG PO TABS: 250.0000 mg | ORAL_TABLET | Freq: Every day | ORAL | 0 refills | Status: AC

## 2017-07-29 MED ORDER — AZITHROMYCIN 250 MG PO TABS
500.0000 mg | ORAL_TABLET | Freq: Once | ORAL | Status: AC
  Administered 2017-07-29: 500 mg via ORAL
  Filled 2017-07-29: qty 2

## 2017-07-29 MED ORDER — ACETAMINOPHEN 500 MG PO TABS
1000.0000 mg | ORAL_TABLET | Freq: Once | ORAL | Status: AC
Start: 2017-07-29 — End: 2017-07-29
  Administered 2017-07-29: 1000 mg via ORAL
  Filled 2017-07-29: qty 2

## 2017-07-29 NOTE — Discharge Instructions (Signed)
It was our pleasure to provide your ER care today - we hope that you feel better.  Rest. Drink plenty of fluids.   Take antibiotic as prescribed.  Take acetaminophen and/or ibuprofen as need for fever.   Follow up with primary care doctor in 1 week if symptoms fail to improve/resolve.  Return to ER if worse, new symptoms, increased trouble breathing, other concern.

## 2017-07-29 NOTE — ED Notes (Signed)
ED Provider at bedside. 

## 2017-07-29 NOTE — ED Triage Notes (Signed)
Head cold and cough x3 days.  Has been getting weaker ever since.
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
## 2018-01-25 IMAGING — CR DG CHEST 2V
2 series · 2 of 2 positions shown · non-contrast
Comparison: PA and lateral chest 11/24/2016.

CLINICAL DATA: Left chest pain for 4 days. Patient for coronary
bypass tomorrow.

EXAM:
CHEST  2 VIEW

[chest pa]
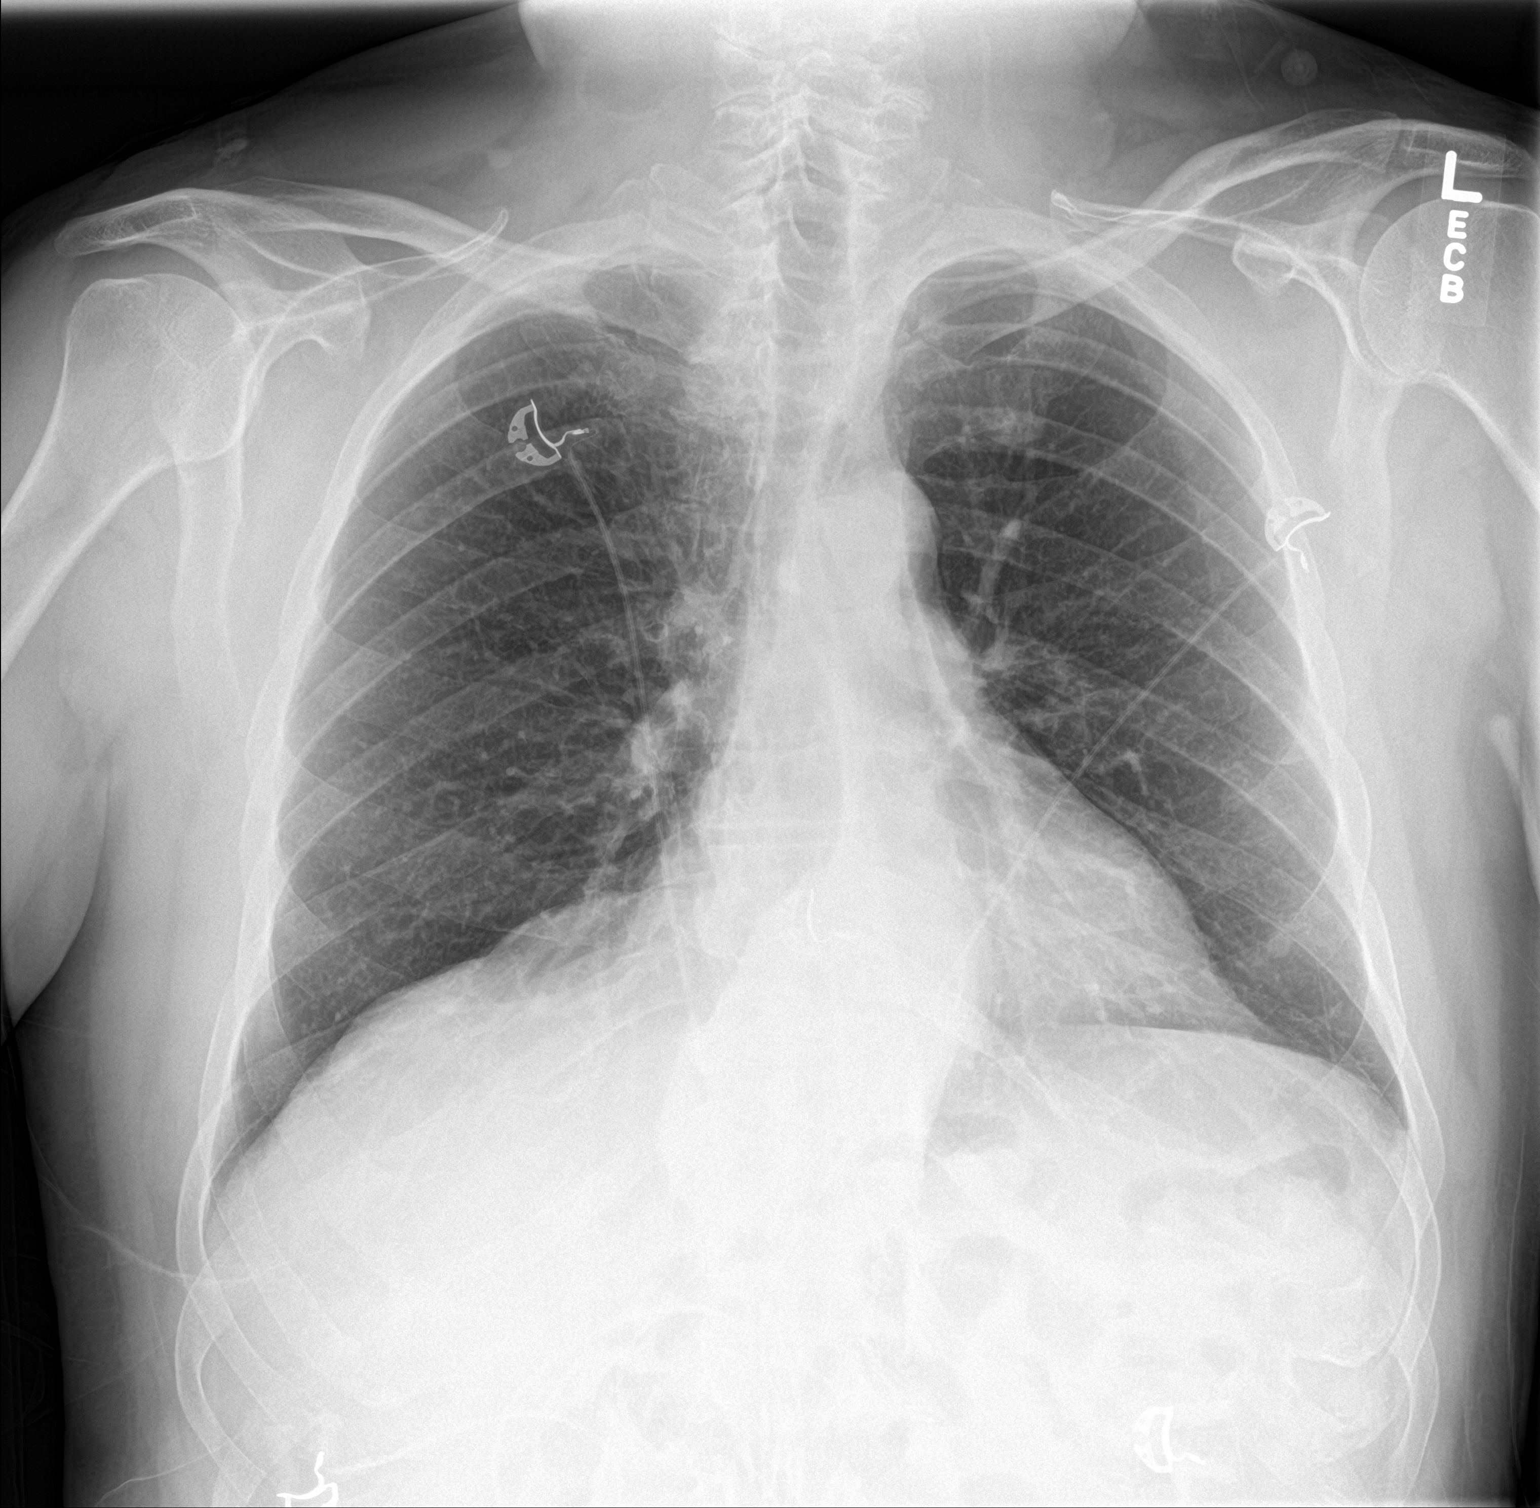

[chest lat]
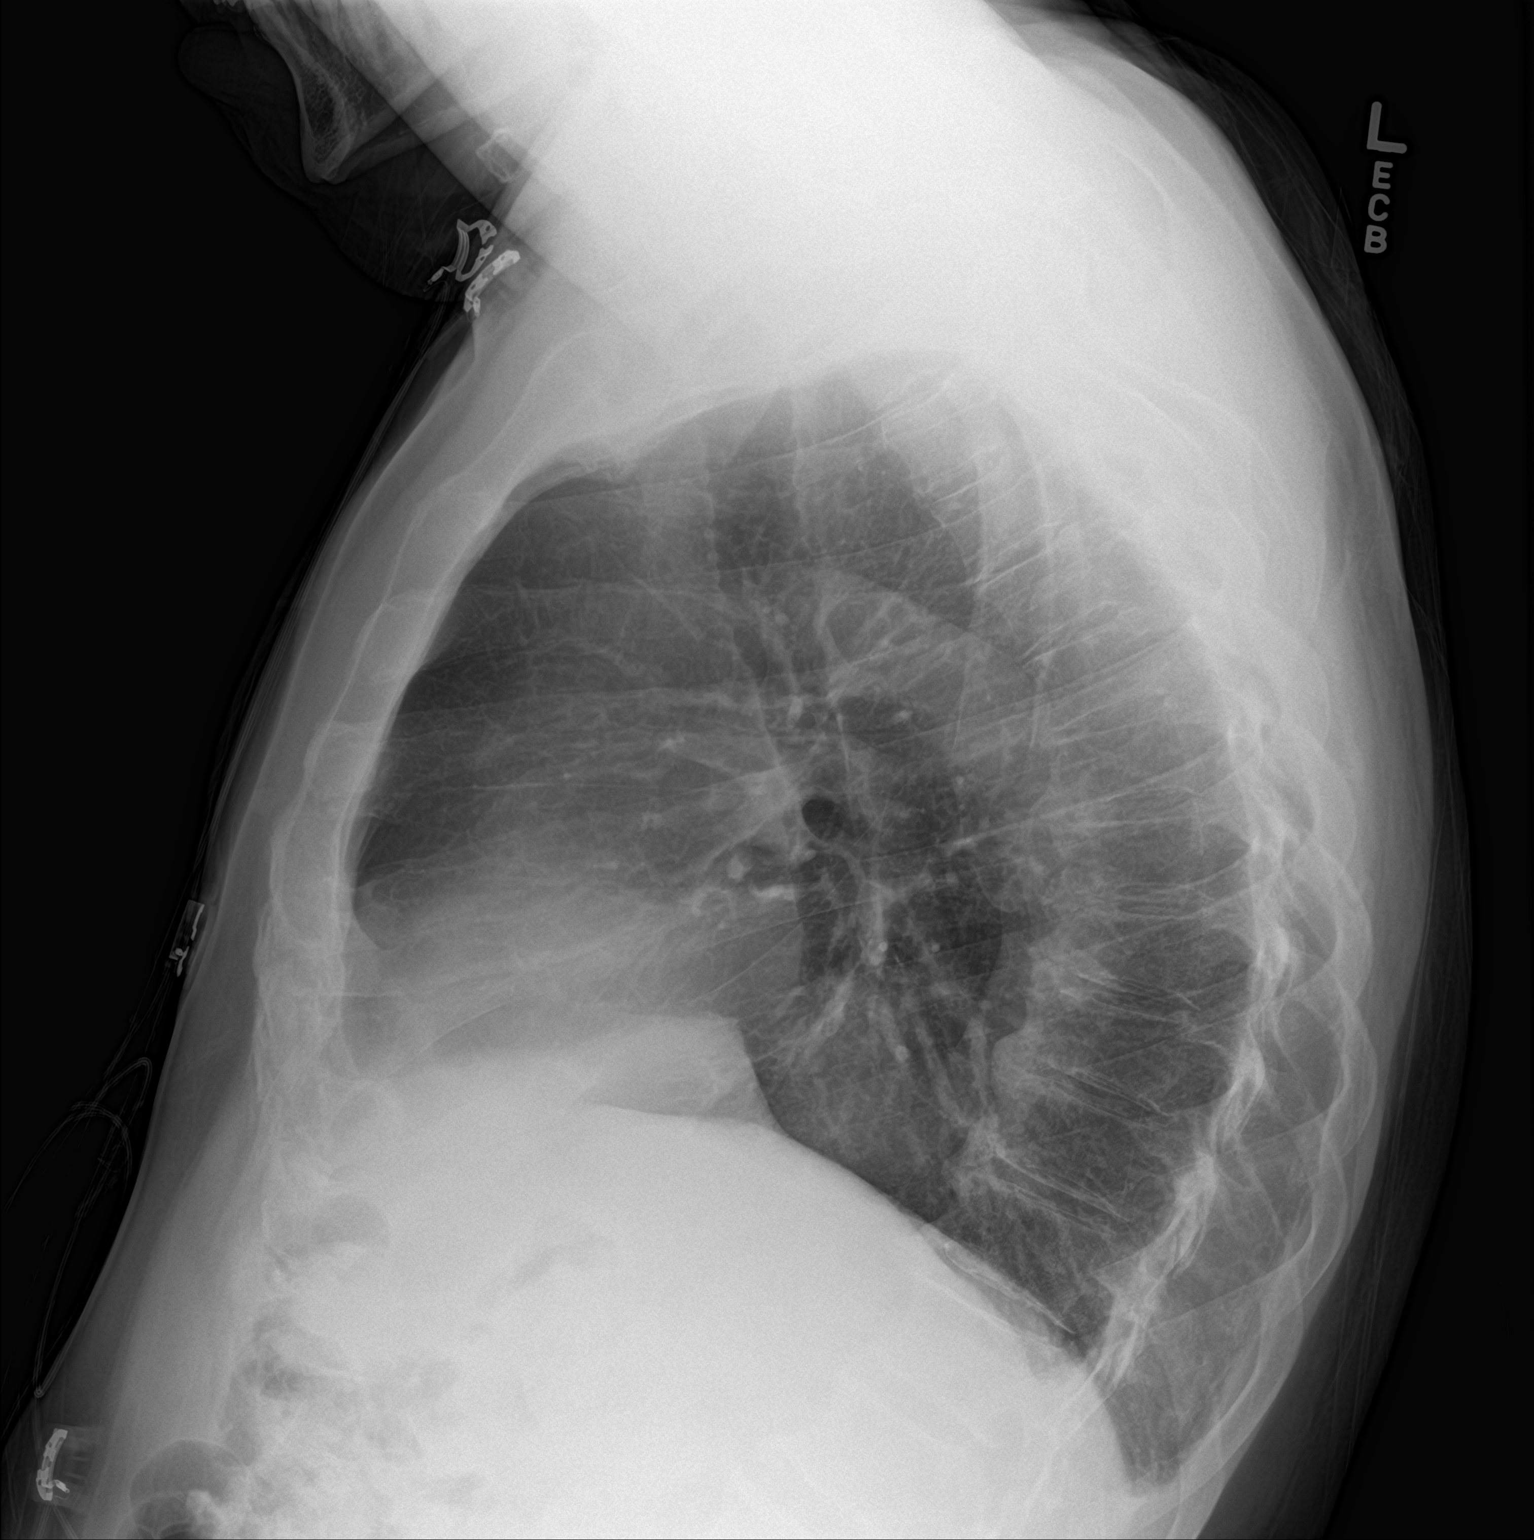

[2 of 2 positions shown; findings below may reference images not displayed]

FINDINGS: Lungs are clear. Heart size is normal. Nipple shadow on the left is
identified. No pneumothorax or pleural effusion. No acute bony
abnormality.
IMPRESSION: No acute disease.

## 2018-09-26 IMAGING — CR DG CHEST 2V
2 series · 2 of 2 positions shown · non-contrast
Comparison: 12/02/2016

CLINICAL DATA: Cough, congestion, fever for 3 days

EXAM:
CHEST  2 VIEW

[w chest lat]
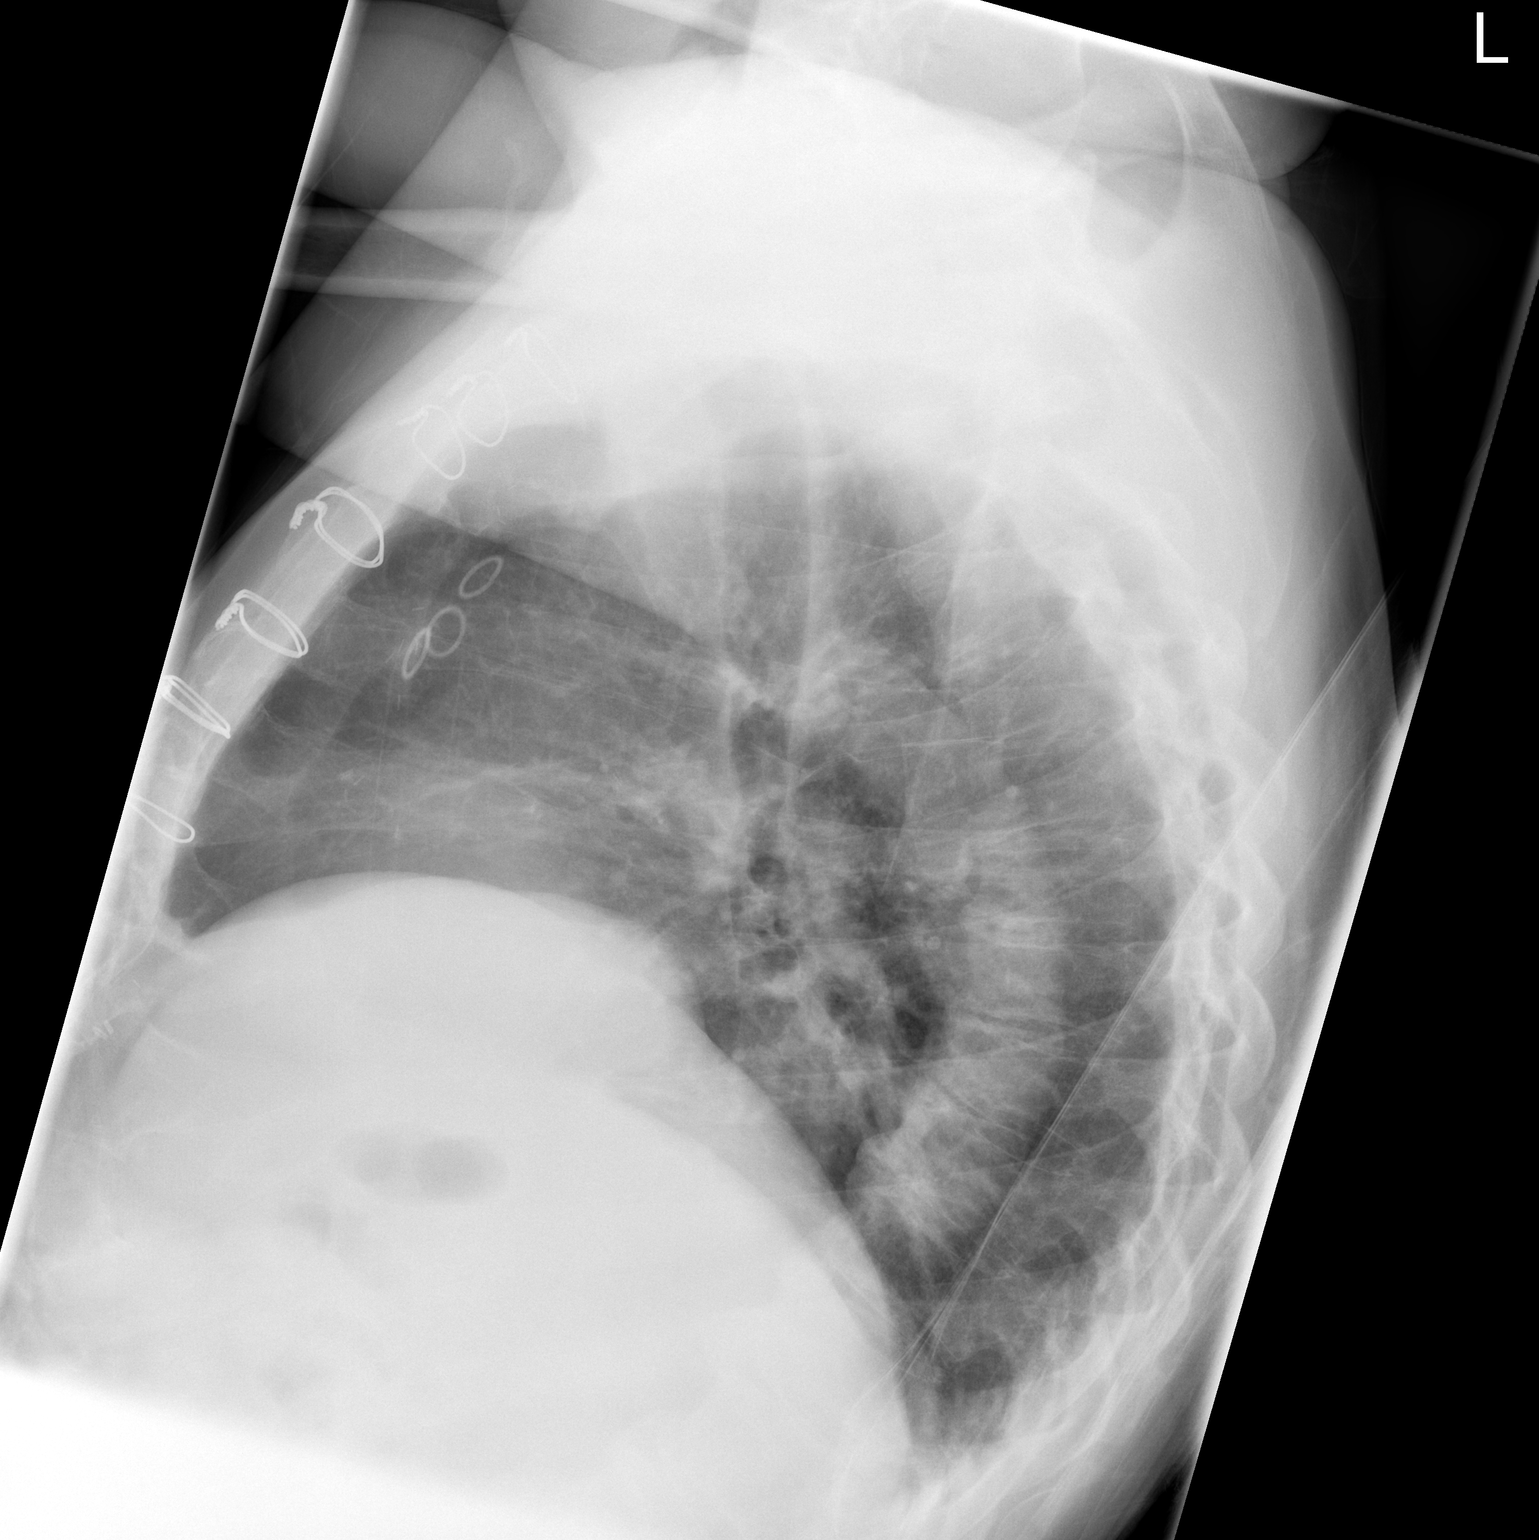

[w chest ap]
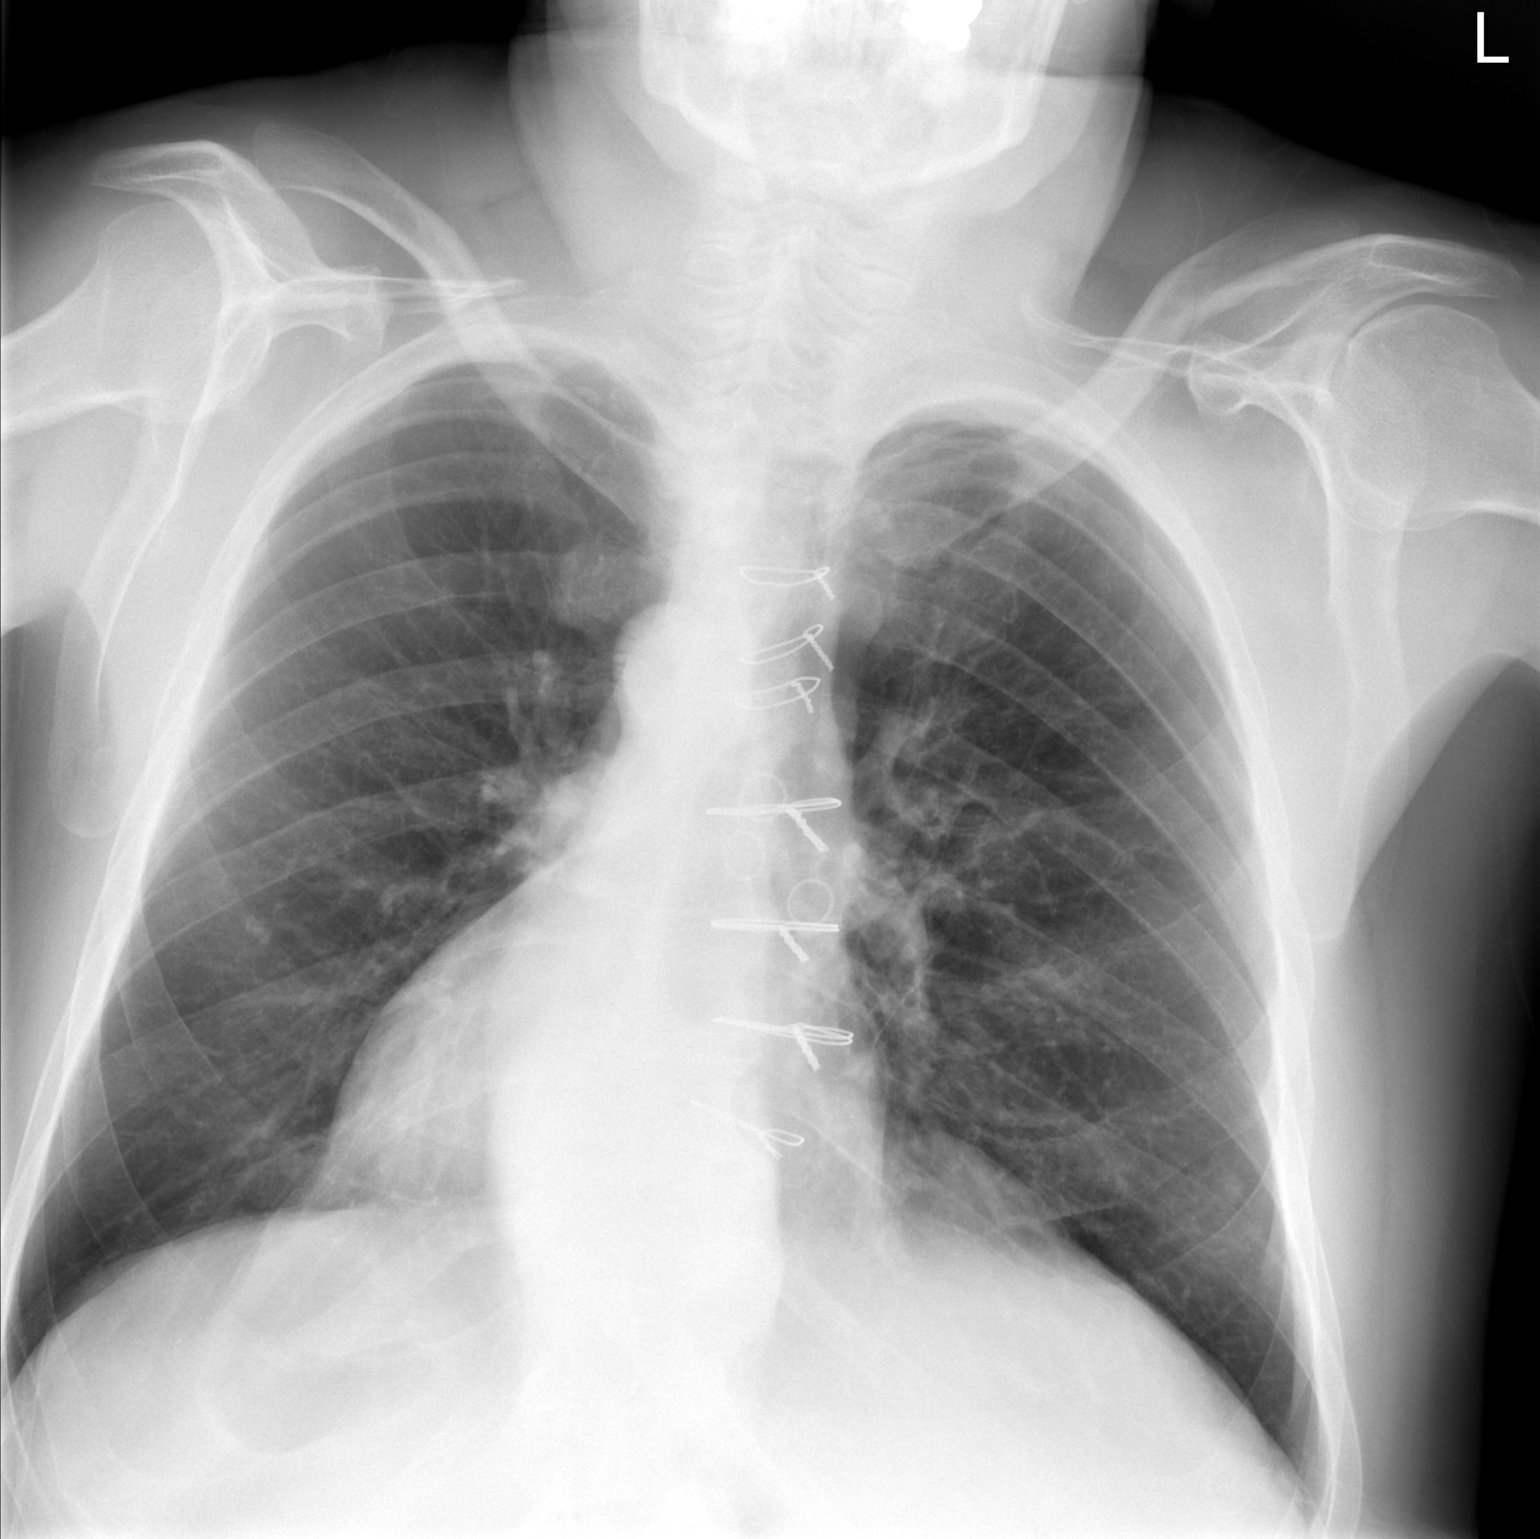

[2 of 2 positions shown; findings below may reference images not displayed]

FINDINGS: There is no focal parenchymal opacity. There is no pleural effusion
or pneumothorax. The heart and mediastinal contours are
unremarkable. There is evidence of prior CABG.

The osseous structures are unremarkable.
IMPRESSION: No active cardiopulmonary disease.
